# Patient Record
Sex: Male | Born: 1950 | Race: Black or African American | Hispanic: No | Marital: Married | Smoking: Never smoker
Health system: Southern US, Community
[De-identification: ages and names within clinical notes are randomized; demographics above are authoritative.]

## PROBLEM LIST (undated history)

## (undated) DIAGNOSIS — I10 Essential (primary) hypertension: Secondary | ICD-10-CM

## (undated) DIAGNOSIS — E785 Hyperlipidemia, unspecified: Secondary | ICD-10-CM

## (undated) DIAGNOSIS — M1990 Unspecified osteoarthritis, unspecified site: Secondary | ICD-10-CM

## (undated) HISTORY — DX: Unspecified osteoarthritis, unspecified site: M19.90

## (undated) HISTORY — DX: Hyperlipidemia, unspecified: E78.5

---

## 1969-02-18 HISTORY — PX: APPENDECTOMY: SHX54

## 1973-02-18 HISTORY — PX: OTHER SURGICAL HISTORY: SHX169

## 1978-02-18 HISTORY — PX: LUNG BIOPSY: SHX232

## 2010-12-08 ENCOUNTER — Emergency Department (INDEPENDENT_AMBULATORY_CARE_PROVIDER_SITE_OTHER): Payer: BC Managed Care – PPO

## 2010-12-08 ENCOUNTER — Encounter: Payer: Self-pay | Admitting: Emergency Medicine

## 2010-12-08 ENCOUNTER — Emergency Department (HOSPITAL_BASED_OUTPATIENT_CLINIC_OR_DEPARTMENT_OTHER)
Admission: EM | Admit: 2010-12-08 | Discharge: 2010-12-08 | Disposition: A | Discharge status: Home / Self Care | Payer: BC Managed Care – PPO | Attending: Emergency Medicine | Admitting: Emergency Medicine

## 2010-12-08 DIAGNOSIS — Z8639 Personal history of other endocrine, nutritional and metabolic disease: Secondary | ICD-10-CM

## 2010-12-08 DIAGNOSIS — M109 Gout, unspecified: Secondary | ICD-10-CM | POA: Insufficient documentation

## 2010-12-08 DIAGNOSIS — M25529 Pain in unspecified elbow: Secondary | ICD-10-CM

## 2010-12-08 DIAGNOSIS — E119 Type 2 diabetes mellitus without complications: Secondary | ICD-10-CM | POA: Insufficient documentation

## 2010-12-08 DIAGNOSIS — M7989 Other specified soft tissue disorders: Secondary | ICD-10-CM

## 2010-12-08 DIAGNOSIS — I10 Essential (primary) hypertension: Secondary | ICD-10-CM | POA: Insufficient documentation

## 2010-12-08 DIAGNOSIS — Z862 Personal history of diseases of the blood and blood-forming organs and certain disorders involving the immune mechanism: Secondary | ICD-10-CM

## 2010-12-08 HISTORY — DX: Essential (primary) hypertension: I10

## 2010-12-08 LAB — GLUCOSE, CAPILLARY: Glucose-Capillary: 125 mg/dL — ABNORMAL HIGH (ref 70–99)

## 2010-12-08 MED ORDER — OXYCODONE-ACETAMINOPHEN 5-325 MG PO TABS
2.0000 | ORAL_TABLET | Freq: Once | ORAL | Status: AC
  Administered 2010-12-08: 2 via ORAL
  Filled 2010-12-08: qty 2

## 2010-12-08 MED ORDER — OXYCODONE-ACETAMINOPHEN 5-325 MG PO TABS: 2.0000 | ORAL_TABLET | ORAL | Status: AC | PRN

## 2010-12-08 MED ORDER — INDOMETHACIN 25 MG PO CAPS: 25.0000 mg | ORAL_CAPSULE | Freq: Three times a day (TID) | ORAL | Status: DC | PRN

## 2010-12-08 NOTE — ED Provider Notes (Signed)
History     CSN: [id number] Arrival date & time: 12/08/2010  8:29 AM   First MD Initiated Contact with Patient 12/08/10 [phone]      Chief Complaint  Patient presents with  . Elbow Pain    (Consider location/radiation/quality/duration/timing/severity/associated sxs/prior treatment) HPI   Pain began in left elbow on Wednesday.  Increasing.  The pain like prior gout attacks.  The pain 10/10.  Patient was taking allopurinol but not taking three years ago when gout was better.    Past Medical History  Diagnosis Date  . Gout   . Diabetes mellitus   . Hypertension    reviewed Past Surgical History  Procedure Date  . Lung biopsy     No family history on file.  History  Substance Use Topics  . Smoking status: Never Smoker   . Smokeless tobacco: Not on file  . Alcohol Use: No      Review of Systems  All other systems reviewed and are negative.    Allergies  Review of patient's allergies indicates no known allergies.  Home Medications  No current outpatient prescriptions on file.  BP 165/97  Pulse 74  Temp(Src) 98 F (36.7 C) (Oral)  Resp 16  SpO2 98%  Physical Exam  Nursing note and vitals reviewed. Constitutional: He is oriented to person, place, and time. He appears well-developed and well-nourished.  HENT:  Head: Normocephalic and atraumatic.  Eyes: Conjunctivae are normal. Pupils are equal, round, and reactive to light.  Neck: Normal range of motion.  Cardiovascular: Normal rate.   Pulmonary/Chest: Effort normal.  Abdominal: Soft.  Musculoskeletal: He exhibits tenderness. He exhibits no edema.       Left elbow with tenderness and swelling posterior aspect.  Full arom.  Neurological: He is alert and oriented to person, place, and time. He has normal reflexes.  Skin: Skin is warm and dry.  Psychiatric: He has a normal mood and affect.    ED Course  Procedures (including critical care time)  Labs Reviewed - No data to display No results  found.   No diagnosis found.    MDM          Hilario Quarry, MD 12/08/10 [phone]

## 2010-12-08 NOTE — ED Notes (Signed)
Pt reports he has not taken rx meds for DM, HTN x 2-3 yrs

## 2010-12-08 NOTE — ED Notes (Signed)
Pt c/o LT elbow pain/ swelling since Wed

## 2011-01-11 ENCOUNTER — Ambulatory Visit (HOSPITAL_BASED_OUTPATIENT_CLINIC_OR_DEPARTMENT_OTHER)
Admission: RE | Admit: 2011-01-11 | Discharge: 2011-01-11 | Disposition: A | Discharge status: Home / Self Care | Payer: BC Managed Care – PPO | Source: Ambulatory Visit | Attending: Internal Medicine | Admitting: Internal Medicine

## 2011-01-11 ENCOUNTER — Telehealth: Payer: Self-pay | Admitting: Internal Medicine

## 2011-01-11 ENCOUNTER — Encounter: Payer: Self-pay | Admitting: Internal Medicine

## 2011-01-11 ENCOUNTER — Ambulatory Visit (INDEPENDENT_AMBULATORY_CARE_PROVIDER_SITE_OTHER): Payer: BC Managed Care – PPO | Admitting: Internal Medicine

## 2011-01-11 VITALS — BP 140/90 | HR 78 | Temp 98.1°F | Resp 18 | Ht 68.5 in | Wt 207.0 lb

## 2011-01-11 DIAGNOSIS — M792 Neuralgia and neuritis, unspecified: Secondary | ICD-10-CM

## 2011-01-11 DIAGNOSIS — Z23 Encounter for immunization: Secondary | ICD-10-CM

## 2011-01-11 DIAGNOSIS — E785 Hyperlipidemia, unspecified: Secondary | ICD-10-CM

## 2011-01-11 DIAGNOSIS — M109 Gout, unspecified: Secondary | ICD-10-CM

## 2011-01-11 DIAGNOSIS — E119 Type 2 diabetes mellitus without complications: Secondary | ICD-10-CM

## 2011-01-11 DIAGNOSIS — M5030 Other cervical disc degeneration, unspecified cervical region: Secondary | ICD-10-CM | POA: Insufficient documentation

## 2011-01-11 DIAGNOSIS — M79609 Pain in unspecified limb: Secondary | ICD-10-CM

## 2011-01-11 DIAGNOSIS — IMO0002 Reserved for concepts with insufficient information to code with codable children: Secondary | ICD-10-CM

## 2011-01-11 DIAGNOSIS — R972 Elevated prostate specific antigen [PSA]: Secondary | ICD-10-CM

## 2011-01-11 LAB — CBC
HCT: 44.8 % (ref 39.0–52.0)
Hemoglobin: 14.6 g/dL (ref 13.0–17.0)
MCH: 27.5 pg (ref 26.0–34.0)
MCHC: 32.6 g/dL (ref 30.0–36.0)
MCV: 84.5 fL (ref 78.0–100.0)
Platelets: 134 10*3/uL — ABNORMAL LOW (ref 150–400)
RBC: 5.3 MIL/uL (ref 4.22–5.81)
RDW: 13.6 % (ref 11.5–15.5)
WBC: 4 10*3/uL (ref 4.0–10.5)

## 2011-01-11 LAB — MICROALBUMIN / CREATININE URINE RATIO
Creatinine, Urine: 236 mg/dL
Microalb Creat Ratio: 96.4 mg/g — ABNORMAL HIGH (ref 0.0–30.0)
Microalb, Ur: 22.76 mg/dL — ABNORMAL HIGH (ref 0.00–1.89)

## 2011-01-11 LAB — BASIC METABOLIC PANEL
BUN: 15 mg/dL (ref 6–23)
CO2: 26 mEq/L (ref 19–32)
Calcium: 9.8 mg/dL (ref 8.4–10.5)
Chloride: 104 mEq/L (ref 96–112)
Creat: 1.2 mg/dL (ref 0.50–1.35)
Glucose, Bld: 107 mg/dL — ABNORMAL HIGH (ref 70–99)
Potassium: 4.2 mEq/L (ref 3.5–5.3)
Sodium: 140 mEq/L (ref 135–145)

## 2011-01-11 LAB — HEPATIC FUNCTION PANEL
ALT: 27 U/L (ref 0–53)
AST: 29 U/L (ref 0–37)
Albumin: 4.6 g/dL (ref 3.5–5.2)
Alkaline Phosphatase: 61 U/L (ref 39–117)
Bilirubin, Direct: 0.1 mg/dL (ref 0.0–0.3)
Indirect Bilirubin: 0.2 mg/dL (ref 0.0–0.9)
Total Bilirubin: 0.3 mg/dL (ref 0.3–1.2)
Total Protein: 7.3 g/dL (ref 6.0–8.3)

## 2011-01-11 LAB — HEMOGLOBIN A1C
Hgb A1c MFr Bld: 6.1 % — ABNORMAL HIGH (ref <5.7)
Mean Plasma Glucose: 128 mg/dL — ABNORMAL HIGH (ref <117)

## 2011-01-11 LAB — PSA: PSA: 9.52 ng/mL — ABNORMAL HIGH (ref <=4.00)

## 2011-01-11 MED ORDER — DICLOFENAC SODIUM 75 MG PO TBEC: 75.0000 mg | DELAYED_RELEASE_TABLET | Freq: Two times a day (BID) | ORAL | Status: DC | PRN

## 2011-01-11 NOTE — Progress Notes (Signed)
  Subjective:    Patient ID: Russell Shannon, male    DOB: 07-30-1950, 60 y.o.   MRN: [id number]  HPI Pt presents to clinic to establish care and for follow up of multiple medical problems. H/o DM previously on metformin but states stopped over a year ago after beginning to exercise and lose wt.  Recent fasting blood sugars 118-135 without hypoglycemia. Has rare gerd sx's with food triggers only. H/o gout with recent exacerbation involving left elbow. Seen in ED and given indocin which he tolerated without gi adverse effect. Pain now resoloved. This recent episode has been the only gout episode this year. Recalls h/o elevated psa with reported neg prostate bx ~2010. No recent follow up of psa. Also recalls past lung bx negative for malignancy. Is a non smoker. Does note chronic intermittent over several months h/o pain located in left trapezius area. Does intermittently radiate down the left arm without paresthesia or weakness. Denies neck pain. Pain improved with aleve. No other complaints.   Past Medical History  Diagnosis Date  . Gout   . Diabetes mellitus   . Hypertension    Past Surgical History  Procedure Date  . Lung biopsy     reports that he has never smoked. He has never used smokeless tobacco. He reports that he does not drink alcohol or use illicit drugs. family history is not on file. No Known Allergies    Review of Systems  Respiratory: Negative for cough and shortness of breath.   Cardiovascular: Negative for chest pain.  Gastrointestinal: Negative for abdominal pain and blood in stool.  Musculoskeletal: Positive for arthralgias.  Neurological: Negative for weakness and numbness.  All other systems reviewed and are negative.       Objective:   Physical Exam  Nursing note and vitals reviewed. Constitutional: He appears well-developed and well-nourished. No distress.  HENT:  Head: Normocephalic and atraumatic.  Right Ear: External ear normal.  Left Ear: External  ear normal.  Nose: Nose normal.  Mouth/Throat: Oropharynx is clear and moist. No oropharyngeal exudate.  Eyes: Conjunctivae are normal. Pupils are equal, round, and reactive to light. No scleral icterus.  Neck: Neck supple. Carotid bruit is not present.  Cardiovascular: Normal rate, regular rhythm, normal heart sounds and intact distal pulses.  Exam reveals no gallop and no friction rub.   No murmur heard. Pulmonary/Chest: Effort normal and breath sounds normal. No respiratory distress. He has no wheezes. He has no rales.  Musculoskeletal:       FROM neck and left shoulder. NT. Strength grossly intact distal/proximal left arm.  Lymphadenopathy:    He has no cervical adenopathy.  Neurological: He is alert.  Skin: Skin is warm and dry. He is not diaphoretic.  Psychiatric: He has a normal mood and affect.  Diabetic foot exam: +2 DP pulses, no diabetic wounds, ulcerations or significant callousing. Monofilament exam nl.         Assessment & Plan:

## 2011-01-11 NOTE — Patient Instructions (Signed)
Please schedule chem7, a1c 250.0 and lipid 272.4 prior to next visit 

## 2011-01-11 NOTE — Assessment & Plan Note (Signed)
Obtain cspine radiograph. Attempt voltaren with food and no other nsaids. Followup if no improvement or worsening.

## 2011-01-11 NOTE — Assessment & Plan Note (Signed)
Not currently fasting. Obtain lipid profile prior to next visit

## 2011-01-11 NOTE — Assessment & Plan Note (Signed)
Obtain cbc, chem7, a1c, urine microalbumin. Currently not requiring medication pending lab results. Schedule yearly optho diabetic eye exam.

## 2011-01-11 NOTE — Telephone Encounter (Signed)
Lab orders entered for February 2013. 

## 2011-01-11 NOTE — Assessment & Plan Note (Signed)
Obtain psa. Understands pro's and cons of psa measurement and potential for false positives. H/o neg prostate bx 2 years ago

## 2011-01-11 NOTE — Assessment & Plan Note (Signed)
Recent episode resolved. No current indication for prophylaxis. Previously took allopurinol remotely.

## 2011-02-15 LAB — HM DIABETES EYE EXAM

## 2011-02-21 ENCOUNTER — Encounter: Payer: Self-pay | Admitting: Internal Medicine

## 2011-02-27 ENCOUNTER — Telehealth: Payer: Self-pay | Admitting: *Deleted

## 2011-02-27 NOTE — Telephone Encounter (Signed)
Pt left voice message stating he would like to proceed with urology referral. Please advise.

## 2011-02-28 ENCOUNTER — Other Ambulatory Visit: Payer: Self-pay | Admitting: Internal Medicine

## 2011-02-28 DIAGNOSIS — R972 Elevated prostate specific antigen [PSA]: Secondary | ICD-10-CM

## 2011-02-28 NOTE — Telephone Encounter (Signed)
Referral placed.

## 2011-02-28 NOTE — Telephone Encounter (Signed)
Left detailed message on home voicemail that referral has been placed and to call us back if he hasn't heard from [place] by the end of next week.

## 2011-03-29 LAB — BASIC METABOLIC PANEL
BUN: 14 mg/dL (ref 6–23)
CO2: 30 mEq/L (ref 19–32)
Calcium: 9.2 mg/dL (ref 8.4–10.5)
Chloride: 105 mEq/L (ref 96–112)
Creat: 0.91 mg/dL (ref 0.50–1.35)
Glucose, Bld: 115 mg/dL — ABNORMAL HIGH (ref 70–99)
Potassium: 4.4 mEq/L (ref 3.5–5.3)
Sodium: 141 mEq/L (ref 135–145)

## 2011-03-29 LAB — HEMOGLOBIN A1C
Hgb A1c MFr Bld: 6.4 % — ABNORMAL HIGH (ref <5.7)
Mean Plasma Glucose: 137 mg/dL — ABNORMAL HIGH (ref <117)

## 2011-03-29 NOTE — Telephone Encounter (Signed)
Addended by: Mervin Kung A on: 03/29/2011 09:49 AM   Modules accepted: Orders

## 2011-03-30 LAB — LIPID PANEL
Cholesterol: 181 mg/dL (ref 0–200)
HDL: 39 mg/dL — ABNORMAL LOW (ref >39)
LDL Cholesterol: 127 mg/dL — ABNORMAL HIGH (ref 0–99)
Total CHOL/HDL Ratio: 4.6 Ratio
Triglycerides: 76 mg/dL (ref <150)
VLDL: 15 mg/dL (ref 0–40)

## 2011-04-05 ENCOUNTER — Ambulatory Visit (HOSPITAL_BASED_OUTPATIENT_CLINIC_OR_DEPARTMENT_OTHER)
Admission: RE | Admit: 2011-04-05 | Discharge: 2011-04-05 | Disposition: A | Discharge status: Home / Self Care | Payer: BC Managed Care – PPO | Source: Ambulatory Visit | Attending: Internal Medicine | Admitting: Internal Medicine

## 2011-04-05 ENCOUNTER — Encounter: Payer: Self-pay | Admitting: Internal Medicine

## 2011-04-05 ENCOUNTER — Ambulatory Visit (INDEPENDENT_AMBULATORY_CARE_PROVIDER_SITE_OTHER): Payer: BC Managed Care – PPO | Admitting: Internal Medicine

## 2011-04-05 VITALS — BP 138/90 | HR 70 | Temp 97.8°F | Resp 18 | Ht 68.5 in | Wt 213.0 lb

## 2011-04-05 DIAGNOSIS — Z23 Encounter for immunization: Secondary | ICD-10-CM

## 2011-04-05 DIAGNOSIS — M25519 Pain in unspecified shoulder: Secondary | ICD-10-CM | POA: Insufficient documentation

## 2011-04-05 DIAGNOSIS — E785 Hyperlipidemia, unspecified: Secondary | ICD-10-CM

## 2011-04-05 MED ORDER — METHYLPREDNISOLONE 4 MG PO KIT: PACK | ORAL | Status: AC

## 2011-04-05 MED ORDER — PRAVASTATIN SODIUM 20 MG PO TABS: 20.0000 mg | ORAL_TABLET | Freq: Every day | ORAL | Status: DC

## 2011-04-05 NOTE — Assessment & Plan Note (Signed)
Begin pravastatin 20mg  po qd.

## 2011-04-05 NOTE — Patient Instructions (Signed)
Please schedule chem7, a1c (250.0) and lipid/lft (272.4) prior to next visit 

## 2011-04-05 NOTE — Progress Notes (Signed)
  Subjective:    Patient ID: Russell Shannon, male    DOB: 12/25/1950, 61 y.o.   MRN: [id number]  HPI Pt presents to clinic for followup of multiple medical problems. Has persistent left trap ?shoulder pain. 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 Presentation today more suggestive of primary shoulder etiology. Pain improved with voltaren but returned when ran out. Now taking advil otc prn with some improvement. Reports fsbs range 108-130 without hypoglycemia. Reviewed ldl elevated. Per pt previously recommended for statin tx but did not take. H/o elevated psa and states is seeing urology today.  Past Medical History  Diagnosis Date  . Gout   . Diabetes mellitus   . Hypertension    Past Surgical History  Procedure Date  . Lung biopsy     reports that he has never smoked. He has never used smokeless tobacco. He reports that he does not drink alcohol or use illicit drugs. family history is not on file. No Known Allergies    Review of Systems see hpi     Objective:   Physical Exam  Physical Exam  Nursing note and vitals reviewed. Constitutional: Appears well-developed and well-nourished. No distress.  HENT:  Head: Normocephalic and atraumatic.  Right Ear: External ear normal.  Left Ear: External ear normal.  Eyes: Conjunctivae are normal. No scleral icterus.  Neck: Neck supple. Carotid bruit is not present.  Cardiovascular: Normal rate, regular rhythm and normal heart sounds.  Exam reveals no gallop and no friction rub.   No murmur heard. Pulmonary/Chest: Effort normal and breath sounds normal. No respiratory distress. He has no wheezes. no rales.  Lymphadenopathy:    He has no cervical adenopathy.  Neurological:Alert.  Skin: Skin is warm and dry. Not diaphoretic.  Psychiatric: Has a normal mood and affect.  MSK: FROM left shoulder. +pain with abduction with resistance. No crepitus. Distal left hand mcp/wrist strength 5/5.     Assessment  & Plan:

## 2011-04-05 NOTE — Assessment & Plan Note (Signed)
Obtain xray left shoulder. Stop advil -attempt medrol dosepak. Consider orthopedic referral if no improvement. Defers PT due to being out of town

## 2011-06-07 ENCOUNTER — Ambulatory Visit (INDEPENDENT_AMBULATORY_CARE_PROVIDER_SITE_OTHER): Payer: BC Managed Care – PPO | Admitting: Internal Medicine

## 2011-06-07 ENCOUNTER — Encounter: Payer: Self-pay | Admitting: Internal Medicine

## 2011-06-07 VITALS — BP 150/100 | HR 74 | Temp 98.2°F | Ht 68.5 in | Wt 218.0 lb

## 2011-06-07 DIAGNOSIS — I10 Essential (primary) hypertension: Secondary | ICD-10-CM | POA: Insufficient documentation

## 2011-06-07 DIAGNOSIS — E785 Hyperlipidemia, unspecified: Secondary | ICD-10-CM

## 2011-06-07 DIAGNOSIS — E119 Type 2 diabetes mellitus without complications: Secondary | ICD-10-CM

## 2011-06-07 MED ORDER — LOSARTAN POTASSIUM 50 MG PO TABS: 50.0000 mg | ORAL_TABLET | Freq: Every day | ORAL | Status: DC

## 2011-06-07 NOTE — Assessment & Plan Note (Signed)
Obtain a1c, chem7 

## 2011-06-07 NOTE — Assessment & Plan Note (Signed)
New dx. Asx. Begin losartan 50mg  qd. Monitor bp as outpt and f/u in clinic as scheduled.

## 2011-06-07 NOTE — Progress Notes (Signed)
  Subjective:    Patient ID: Russell Shannon, male    DOB: 1950-09-18, 61 y.o.   MRN: [id number]  HPI HPIPt presents to clinic for followup of multiple medical problems. Tolerates statin tx without myalgias. BP reviewed persistently elevated without sx's. H/o elevated psa and now s/p urology consult. States underwent neg bx and is now on unspecified medication for enlarged prostate.     Past Medical History  Diagnosis Date  . Gout   . Diabetes mellitus   . Hypertension    Past Surgical History  Procedure Date  . Lung biopsy     reports that he has never smoked. He has never used smokeless tobacco. He reports that he does not drink alcohol or use illicit drugs. family history is not on file. No Known Allergies   Review of Systems see hpi     Objective:   Physical Exam  Physical Exam  Nursing note and vitals reviewed. Constitutional: Appears well-developed and well-nourished. No distress.  HENT:  Head: Normocephalic and atraumatic.  Right Ear: External ear normal.  Left Ear: External ear normal.  Eyes: Conjunctivae are normal. No scleral icterus.  Neck: Neck supple. Carotid bruit is not present.  Cardiovascular: Normal rate, regular rhythm and normal heart sounds.  Exam reveals no gallop and no friction rub.   No murmur heard. Pulmonary/Chest: Effort normal and breath sounds normal. No respiratory distress. He has no wheezes. no rales.  Lymphadenopathy:    He has no cervical adenopathy.  Neurological:Alert.  Skin: Skin is warm and dry. Not diaphoretic.  Psychiatric: Has a normal mood and affect.  Diabetic foot exam: +2 DP pulses, no diabetic wounds, ulcerations or significant callousing. Monofilament exam nl.       Assessment & Plan:

## 2011-06-07 NOTE — Assessment & Plan Note (Signed)
Obtain lipid/lft. 

## 2011-06-08 LAB — HEPATIC FUNCTION PANEL
ALT: 30 U/L (ref 0–53)
AST: 34 U/L (ref 0–37)
Albumin: 4.5 g/dL (ref 3.5–5.2)
Alkaline Phosphatase: 52 U/L (ref 39–117)
Bilirubin, Direct: 0.1 mg/dL (ref 0.0–0.3)
Indirect Bilirubin: 0.3 mg/dL (ref 0.0–0.9)
Total Bilirubin: 0.4 mg/dL (ref 0.3–1.2)
Total Protein: 7.1 g/dL (ref 6.0–8.3)

## 2011-06-08 LAB — LIPID PANEL
Cholesterol: 207 mg/dL — ABNORMAL HIGH (ref 0–200)
HDL: 47 mg/dL (ref >39)
LDL Cholesterol: 141 mg/dL — ABNORMAL HIGH (ref 0–99)
Total CHOL/HDL Ratio: 4.4 Ratio
Triglycerides: 97 mg/dL (ref <150)
VLDL: 19 mg/dL (ref 0–40)

## 2011-06-08 LAB — BASIC METABOLIC PANEL
BUN: 13 mg/dL (ref 6–23)
CO2: 24 mEq/L (ref 19–32)
Calcium: 9.3 mg/dL (ref 8.4–10.5)
Chloride: 105 mEq/L (ref 96–112)
Creat: 0.86 mg/dL (ref 0.50–1.35)
Glucose, Bld: 116 mg/dL — ABNORMAL HIGH (ref 70–99)
Potassium: 3.9 mEq/L (ref 3.5–5.3)
Sodium: 140 mEq/L (ref 135–145)

## 2011-06-08 LAB — HEMOGLOBIN A1C
Hgb A1c MFr Bld: 6.4 % — ABNORMAL HIGH (ref <5.7)
Mean Plasma Glucose: 137 mg/dL — ABNORMAL HIGH (ref <117)

## 2011-06-14 ENCOUNTER — Other Ambulatory Visit: Payer: Self-pay | Admitting: Internal Medicine

## 2011-06-14 DIAGNOSIS — E785 Hyperlipidemia, unspecified: Secondary | ICD-10-CM

## 2011-06-14 MED ORDER — PRAVASTATIN SODIUM 40 MG PO TABS: 40.0000 mg | ORAL_TABLET | Freq: Every day | ORAL | Status: DC

## 2011-07-05 LAB — HEPATIC FUNCTION PANEL
ALT: 22 U/L (ref 0–53)
AST: 28 U/L (ref 0–37)
Albumin: 4.6 g/dL (ref 3.5–5.2)
Alkaline Phosphatase: 43 U/L (ref 39–117)
Bilirubin, Direct: 0.1 mg/dL (ref 0.0–0.3)
Indirect Bilirubin: 0.4 mg/dL (ref 0.0–0.9)
Total Bilirubin: 0.5 mg/dL (ref 0.3–1.2)
Total Protein: 7 g/dL (ref 6.0–8.3)

## 2011-07-05 NOTE — Progress Notes (Signed)
Addended by: Mervin Kung A on: 07/05/2011 10:19 AM   Modules accepted: Orders

## 2011-07-06 LAB — LIPID PANEL
Cholesterol: 172 mg/dL (ref 0–200)
HDL: 42 mg/dL (ref >39)
LDL Cholesterol: 117 mg/dL — ABNORMAL HIGH (ref 0–99)
Total CHOL/HDL Ratio: 4.1 Ratio
Triglycerides: 65 mg/dL (ref <150)
VLDL: 13 mg/dL (ref 0–40)

## 2011-07-12 ENCOUNTER — Telehealth: Payer: Self-pay | Admitting: Internal Medicine

## 2011-07-12 ENCOUNTER — Ambulatory Visit (INDEPENDENT_AMBULATORY_CARE_PROVIDER_SITE_OTHER): Payer: BC Managed Care – PPO | Admitting: Internal Medicine

## 2011-07-12 ENCOUNTER — Encounter: Payer: Self-pay | Admitting: Internal Medicine

## 2011-07-12 VITALS — BP 118/82 | HR 67 | Temp 98.0°F | Resp 18 | Wt 215.0 lb

## 2011-07-12 DIAGNOSIS — I10 Essential (primary) hypertension: Secondary | ICD-10-CM

## 2011-07-12 DIAGNOSIS — E785 Hyperlipidemia, unspecified: Secondary | ICD-10-CM

## 2011-07-12 DIAGNOSIS — E119 Type 2 diabetes mellitus without complications: Secondary | ICD-10-CM

## 2011-07-12 NOTE — Progress Notes (Signed)
  Subjective:    Patient ID: Russell Shannon, male    DOB: 07/17/1950, 61 y.o.   MRN: [id number]  HPI Pt presents to clinic for followup of multiple medical problems. Has recent dx of HTN and placed on losartan 50mg  qd. Tolerating without side effect. Denies cough. bp reviewed normotensive. Reviewed a1c 6.4. LDL reviewed mildly above goal.   Past Medical History  Diagnosis Date  . Gout   . Diabetes mellitus   . Hypertension    Past Surgical History  Procedure Date  . Lung biopsy     reports that he has never smoked. He has never used smokeless tobacco. He reports that he does not drink alcohol or use illicit drugs. family history is not on file. No Known Allergies    Review of Systems see hpi     Objective:   Physical Exam  Physical Exam  Nursing note and vitals reviewed. Constitutional: Appears well-developed and well-nourished. No distress.  HENT:  Head: Normocephalic and atraumatic.  Right Ear: External ear normal.  Left Ear: External ear normal.  Eyes: Conjunctivae are normal. No scleral icterus.  Neck: Neck supple. Cardiovascular: Normal rate, regular rhythm and normal heart sounds.  Exam reveals no gallop and no friction rub.   No murmur heard. Pulmonary/Chest: Effort normal and breath sounds normal. No respiratory distress. He has no wheezes. no rales.   Neurological:Alert.  Skin: Skin is warm and dry. Not diaphoretic.  Psychiatric: Has a normal mood and affect.        Assessment & Plan:

## 2011-07-12 NOTE — Patient Instructions (Signed)
Please schedule fasting labs prior to next visit Chem7, a1c-250.0 and lipid-272.4 

## 2011-07-12 NOTE — Assessment & Plan Note (Signed)
Normotensive and stable. Continue current regimen. Monitor bp as outpt and followup in clinic as scheduled.  

## 2011-07-12 NOTE — Telephone Encounter (Signed)
Lab order entered for August 2013. 

## 2011-07-12 NOTE — Assessment & Plan Note (Signed)
LDL above goal. Continue current statin dose. Increase exercise and pursue low fat diet. Obtain lipid prior to next visit. If remains suboptimal then address medication

## 2011-07-12 NOTE — Assessment & Plan Note (Signed)
Good control. Encourage appropriate diabetic diet and regular exercise. Obtain chem7, a1c prior to next visit

## 2011-07-18 ENCOUNTER — Telehealth: Payer: Self-pay | Admitting: Internal Medicine

## 2011-07-18 DIAGNOSIS — E785 Hyperlipidemia, unspecified: Secondary | ICD-10-CM

## 2011-07-18 MED ORDER — PRAVASTATIN SODIUM 40 MG PO TABS: 40.0000 mg | ORAL_TABLET | Freq: Every day | ORAL | Status: DC

## 2011-07-18 MED ORDER — LOSARTAN POTASSIUM 50 MG PO TABS: 50.0000 mg | ORAL_TABLET | Freq: Every day | ORAL | Status: DC

## 2011-07-18 NOTE — Telephone Encounter (Signed)
Rx sent to [facility]

## 2011-07-18 NOTE — Telephone Encounter (Signed)
New prescription request  Pravastatin sodium 20mg  tab. Qty 90 days supply. Refills 4  Losartan potassium 50mg  tab. Qty 90 days supply. Refills 4

## 2011-07-26 ENCOUNTER — Telehealth: Payer: Self-pay | Admitting: Internal Medicine

## 2011-07-26 NOTE — Telephone Encounter (Signed)
Patient called and left a voice message requesting medication refills to [facility].  Call was returned to patient at [phone], no answer. A voice message was left for patient to return phone call with the name of the medications he is requesting refills on.

## 2011-07-29 NOTE — Telephone Encounter (Signed)
Call placed to patient at [phone], no answer. A detailed voice message was left for patient to return call regarding medication refill.

## 2011-08-05 ENCOUNTER — Encounter: Payer: Self-pay | Admitting: Internal Medicine

## 2011-08-05 ENCOUNTER — Ambulatory Visit (INDEPENDENT_AMBULATORY_CARE_PROVIDER_SITE_OTHER): Payer: BC Managed Care – PPO | Admitting: Internal Medicine

## 2011-08-05 VITALS — BP 122/90 | HR 77 | Temp 98.2°F | Resp 18 | Ht 68.5 in | Wt 214.0 lb

## 2011-08-05 DIAGNOSIS — M109 Gout, unspecified: Secondary | ICD-10-CM

## 2011-08-05 DIAGNOSIS — E119 Type 2 diabetes mellitus without complications: Secondary | ICD-10-CM

## 2011-08-05 MED ORDER — COLCHICINE 0.6 MG PO TABS: 0.6000 mg | ORAL_TABLET | Freq: Two times a day (BID) | ORAL | Status: DC | PRN

## 2011-08-05 MED ORDER — METHYLPREDNISOLONE ACETATE 20 MG/ML IJ SUSP
20.0000 mg | Freq: Once | INTRAMUSCULAR | Status: AC
  Administered 2011-08-05: 20 mg via INTRAMUSCULAR

## 2011-08-05 NOTE — Assessment & Plan Note (Signed)
Discussed potential for hyperglycemia with steroid injxn. DM under good control. Advised close daily monitoring of fsbs.

## 2011-08-05 NOTE — Assessment & Plan Note (Signed)
Administer depomedrol 20mg  im. Monitor fsbs carefully. Begin colchicine bid prn. Followup if no improvement or worsening.

## 2011-08-05 NOTE — Progress Notes (Signed)
  Subjective:    Patient ID: Russell Shannon, male    DOB: Jan 15, 1951, 61 y.o.   MRN: [id number]  HPI Pt presents to clinic for evaluation of left elbow pain. Notes 3-4 day h/o left elbow pain, swelling and warmth. States is c/w previous gout attacks. No fever, chills, break in skin or drainage. Took unspecified pain medication at home with some improvement. Swelling and pain some better today. H/o DM well controlled with last a1c 6.4. No other alleviating or exacerbating factors.   Past Medical History  Diagnosis Date  . Gout   . Diabetes mellitus   . Hypertension    Past Surgical History  Procedure Date  . Lung biopsy     reports that he has never smoked. He has never used smokeless tobacco. He reports that he does not drink alcohol or use illicit drugs. family history is not on file. No Known Allergies   Review of Systems see hpi     Objective:   Physical Exam  Nursing note and vitals reviewed. Constitutional: He appears well-developed and well-nourished. No distress.  Musculoskeletal:       Left elbow:FROM. Slightly tender to palpation. Mild warmth. +ST swelling without obvious effusion.   Neurological: He is alert.  Skin: Skin is warm and dry. No rash noted. He is not diaphoretic. No erythema.  Psychiatric: He has a normal mood and affect.          Assessment & Plan:

## 2011-10-15 ENCOUNTER — Ambulatory Visit (INDEPENDENT_AMBULATORY_CARE_PROVIDER_SITE_OTHER): Payer: Self-pay | Admitting: Internal Medicine

## 2011-10-15 ENCOUNTER — Encounter: Payer: Self-pay | Admitting: Internal Medicine

## 2011-10-15 VITALS — BP 116/82 | HR 94 | Temp 98.5°F | Resp 16 | Wt 210.5 lb

## 2011-10-15 DIAGNOSIS — R059 Cough, unspecified: Secondary | ICD-10-CM

## 2011-10-15 DIAGNOSIS — R05 Cough: Secondary | ICD-10-CM

## 2011-10-15 DIAGNOSIS — I319 Disease of pericardium, unspecified: Secondary | ICD-10-CM

## 2011-10-15 MED ORDER — AMOXICILLIN-POT CLAVULANATE 875-125 MG PO TABS: 1.0000 | ORAL_TABLET | Freq: Two times a day (BID) | ORAL | Status: AC

## 2011-10-15 NOTE — Progress Notes (Signed)
  Subjective:    Patient ID: Russell Shannon, male    DOB: [birth year], 61 y.o.   MRN: [id number]  HPI Pt presents to clinic for hospital follow up of pericarditis. Discharge summary reviewed from [facility]. Developed cp and was diagnosed with pericarditis with secondary effusion without tamponade. Etiology suspected to be viral infxn. States cp resolved and has f/u appt with cardiology in near future. Has persistent np cough for two weeks that began in hospital. Apparently underwent chest CT without mass or infiltrate but mention of atelectasis. Denies f/c, hemoptysis or wheezing.   Past Medical History  Diagnosis Date  . Gout   . Diabetes mellitus   . Hypertension    Past Surgical History  Procedure Date  . Lung biopsy     reports that he has never smoked. He has never used smokeless tobacco. He reports that he does not drink alcohol or use illicit drugs. family history is not on file. No Known Allergies   Review of Systems see hpi     Objective:   Physical Exam  Nursing note and vitals reviewed. Constitutional: He appears well-developed and well-nourished. No distress.  HENT:  Head: Normocephalic and atraumatic.  Right Ear: External ear normal.  Left Ear: External ear normal.  Eyes: Conjunctivae are normal. No scleral icterus.  Cardiovascular: Normal rate, regular rhythm and normal heart sounds.  Exam reveals no gallop and no friction rub.   No murmur heard. Pulmonary/Chest: Effort normal and breath sounds normal. No respiratory distress. He has no wheezes. He has no rales.  Neurological: He is alert.  Skin: He is not diaphoretic.  Psychiatric: He has a normal mood and affect.          Assessment & Plan:

## 2011-10-15 NOTE — Assessment & Plan Note (Signed)
Begin abx tx. If no resolution with abx course return to clinic for re-evaluation and cxr.

## 2011-10-15 NOTE — Assessment & Plan Note (Signed)
Asx. Keep cardiology follow up

## 2011-10-18 ENCOUNTER — Ambulatory Visit: Payer: BC Managed Care – PPO | Admitting: Internal Medicine

## 2011-11-12 ENCOUNTER — Ambulatory Visit: Payer: Self-pay | Admitting: Internal Medicine

## 2011-11-12 DIAGNOSIS — Z0289 Encounter for other administrative examinations: Secondary | ICD-10-CM

## 2012-08-27 ENCOUNTER — Other Ambulatory Visit: Payer: Self-pay

## 2012-10-17 ENCOUNTER — Emergency Department (HOSPITAL_BASED_OUTPATIENT_CLINIC_OR_DEPARTMENT_OTHER)
Admission: EM | Admit: 2012-10-17 | Discharge: 2012-10-17 | Disposition: A | Discharge status: Home / Self Care | Payer: BC Managed Care – PPO | Attending: Emergency Medicine | Admitting: Emergency Medicine

## 2012-10-17 ENCOUNTER — Emergency Department (HOSPITAL_BASED_OUTPATIENT_CLINIC_OR_DEPARTMENT_OTHER): Payer: BC Managed Care – PPO

## 2012-10-17 ENCOUNTER — Encounter (HOSPITAL_BASED_OUTPATIENT_CLINIC_OR_DEPARTMENT_OTHER): Payer: Self-pay | Admitting: *Deleted

## 2012-10-17 DIAGNOSIS — E119 Type 2 diabetes mellitus without complications: Secondary | ICD-10-CM | POA: Insufficient documentation

## 2012-10-17 DIAGNOSIS — M109 Gout, unspecified: Secondary | ICD-10-CM

## 2012-10-17 DIAGNOSIS — M2540 Effusion, unspecified joint: Secondary | ICD-10-CM | POA: Insufficient documentation

## 2012-10-17 DIAGNOSIS — M779 Enthesopathy, unspecified: Secondary | ICD-10-CM

## 2012-10-17 DIAGNOSIS — M7650 Patellar tendinitis, unspecified knee: Secondary | ICD-10-CM | POA: Insufficient documentation

## 2012-10-17 DIAGNOSIS — Z79899 Other long term (current) drug therapy: Secondary | ICD-10-CM | POA: Insufficient documentation

## 2012-10-17 DIAGNOSIS — I10 Essential (primary) hypertension: Secondary | ICD-10-CM | POA: Insufficient documentation

## 2012-10-17 LAB — BASIC METABOLIC PANEL
BUN: 13 mg/dL (ref 6–23)
CO2: 26 mEq/L (ref 19–32)
Calcium: 9.5 mg/dL (ref 8.4–10.5)
Chloride: 104 mEq/L (ref 96–112)
Creatinine, Ser: 1 mg/dL (ref 0.50–1.35)
GFR calc Af Amer: >90 mL/min (ref >90)
GFR calc non Af Amer: 78 mL/min — ABNORMAL LOW (ref >90)
Glucose, Bld: 141 mg/dL — ABNORMAL HIGH (ref 70–99)
Potassium: 3.6 mEq/L (ref 3.5–5.1)
Sodium: 140 mEq/L (ref 135–145)

## 2012-10-17 MED ORDER — COLCHICINE 0.6 MG PO TABS: 0.6000 mg | ORAL_TABLET | Freq: Two times a day (BID) | ORAL | Status: DC | PRN

## 2012-10-17 NOTE — ED Provider Notes (Signed)
CSN: [id number]     Arrival date & time 10/17/12  0848 History   None    Chief Complaint  Patient presents with  . Knee Pain   (Consider location/radiation/quality/duration/timing/severity/associated sxs/prior Treatment) HPI  This is a 62 year old woman with a history of gout, diabetes, and hypertension who presents with right knee pain. Patient reports one week history of worsening right knee pain. He has a history of gout and has had gout in his knee before. He states this is similar. He denies any fevers or systemic symptoms. Patient took his colchicine with some improvement of his pain. He has run out of his colchicine. Patient states that he works [employer] and is on his knees a lot. He may have hurt his right knee this past week.  Past Medical History  Diagnosis Date  . Gout   . Diabetes mellitus   . Hypertension    Past Surgical History  Procedure Laterality Date  . Lung biopsy     No family history on file. History  Substance Use Topics  . Smoking status: Never Smoker   . Smokeless tobacco: Never Used  . Alcohol Use: No    Review of Systems  Constitutional: Negative.  Negative for fever and chills.  Respiratory: Negative.  Negative for chest tightness and shortness of breath.   Cardiovascular: Negative.  Negative for chest pain.  Gastrointestinal: Negative.  Negative for abdominal pain.  Genitourinary: Negative.  Negative for dysuria.  Musculoskeletal: Positive for joint swelling.       Right knee  Skin: Negative for color change and rash.  Neurological: Negative for headaches.  All other systems reviewed and are negative.    Allergies  Review of patient's allergies indicates no known allergies.  Home Medications   Current Outpatient Rx  Name  Route  Sig  Dispense  Refill  . acetaminophen (TYLENOL) 650 MG CR tablet   Oral   Take 650 mg by mouth every 6 (six) hours as needed. Every 4-6 hours PRN Pain.         . colchicine 0.6 MG tablet   Oral   Take  1 tablet (0.6 mg total) by mouth 2 (two) times daily as needed.   30 tablet   0   . finasteride (PROSCAR) 5 MG tablet   Oral   Take 5 mg by mouth at bedtime and may repeat dose one time if needed.         Marland Kitchen EXPIRED: losartan (COZAAR) 50 MG tablet   Oral   Take 1 tablet (50 mg total) by mouth daily.   90 tablet   1   . EXPIRED: pravastatin (PRAVACHOL) 40 MG tablet   Oral   Take 1 tablet (40 mg total) by mouth daily.   90 tablet   1    BP 152/86  Pulse 77  Temp(Src) 97.9 F (36.6 C) (Oral)  Resp 16  Ht 5\' 9"  (1.753 m)  Wt 210 lb (95.255 kg)  BMI 31 kg/m2  SpO2 98% Physical Exam  Nursing note and vitals reviewed. Constitutional: He is oriented to person, place, and time. He appears well-developed and well-nourished.  HENT:  Head: Normocephalic and atraumatic.  Cardiovascular: Normal rate, regular rhythm and normal heart sounds.   No murmur heard. Pulmonary/Chest: Effort normal and breath sounds normal. No respiratory distress.  Abdominal: Soft. Bowel sounds are normal. There is no tenderness. There is no rebound.  Musculoskeletal:  Focused examination of the right knee shows a moderate knee effusion. Patient has  full range of motion both passively and actively. There is no notable erythema or warmth. Distal pulses are intact.  Lymphadenopathy:    He has no cervical adenopathy.  Neurological: He is alert and oriented to person, place, and time.  Skin: Skin is warm and dry. No erythema.  Psychiatric: He has a normal mood and affect.    ED Course  Procedures (including critical care time) Labs Review Labs Reviewed  BASIC METABOLIC PANEL - Abnormal; Notable for the following:    Glucose, Bld 141 (*)    GFR calc non Af Amer 78 (*)    All other components within normal limits   Imaging Review Dg Knee 2 Views Right  10/17/2012   *RADIOLOGY REPORT*  Clinical Data: Anterior knee pain  RIGHT KNEE - 1-2 VIEW  Comparison: None.  Findings: Normal alignment without  fracture or effusion.  Anterior knee patellar tendon region soft tissue thickening noted.  This can be seen with tendonitis.  Preserved joint spaces.  No significant arthropathy.  IMPRESSION: Patellar tendon region soft tissue swelling.  No acute osseous finding.   Original Report Authenticated By: Judie Petit. Miles Costain, M.D.    MDM   1. Gout attack   2. Tendonitis      This is a 62 year old male with history of diabetes, hypertension, and gout who presents with right knee pain. Patient is well-appearing on exam and his vital signs are reassuring. Examination of the knee shows a moderate effusion without warmth or redness. Patient has full range of motion and I have low suspicion for septic joint given that he has no history of fever and is well-appearing systemically. Given his history of gout in that knee and colchicine improved his pain, my suspicion is this is a gout attack. I discussed with the patient that while I think he said low risk for infection, I cannot rule it out without tapping the joint.  He defers arthrocentesis at this time. Plain film was obtained. A BMP was obtained to evaluate the patient's kidney function for treatment of gout.  X-ray shows swelling around the patellar tendon that may represent tendinitis. At this time I feel patient's symptoms are likely secondary to gout and/or tendinitis, both of which would be treated with RICE  therapy and NSAIDs. Given the patient's age and reduce GFR, I feel coursing is the best was for his gout. Until immediate add ibuprofen every 6 hours for additional pain relief for the next few days in case there is also a tendinitis component. Patient stated understanding.  After history, exam, and medical workup I feel the patient has been appropriately medically screened and is safe for discharge home. Pertinent diagnoses were discussed with the patient. Patient was given return precautions.  Shon Baton, MD 10/17/12 [phone]

## 2012-10-17 NOTE — ED Notes (Signed)
Patient c/o R knee pain for the past week, since Thursday the pain and swelling has grown worse. Patient states he had fluid removed from his knee several years ago. Took some pills he had for gout but no relief

## 2012-10-17 NOTE — ED Notes (Signed)
Pain 9/10, throbbing, aggravate by movement, relieved by rest. Able to ambulate. Redness and swelling noted, painful at palpation. His job requires him be on his knee a lot, "I may have bump my knee on something"

## 2015-01-19 ENCOUNTER — Telehealth: Payer: Self-pay

## 2015-01-19 NOTE — Telephone Encounter (Signed)
Pre Visit call completed. 

## 2015-01-20 ENCOUNTER — Ambulatory Visit (INDEPENDENT_AMBULATORY_CARE_PROVIDER_SITE_OTHER): Payer: BLUE CROSS/BLUE SHIELD | Admitting: Medical

## 2015-01-20 ENCOUNTER — Ambulatory Visit (HOSPITAL_BASED_OUTPATIENT_CLINIC_OR_DEPARTMENT_OTHER)
Admission: RE | Admit: 2015-01-20 | Discharge: 2015-01-20 | Disposition: A | Discharge status: Home / Self Care | Payer: BLUE CROSS/BLUE SHIELD | Source: Ambulatory Visit | Attending: Medical | Admitting: Medical

## 2015-01-20 ENCOUNTER — Encounter: Payer: Self-pay | Admitting: Medical

## 2015-01-20 VITALS — BP 140/90 | HR 86 | Temp 98.0°F | Wt 220.0 lb

## 2015-01-20 DIAGNOSIS — M4186 Other forms of scoliosis, lumbar region: Secondary | ICD-10-CM | POA: Diagnosis not present

## 2015-01-20 DIAGNOSIS — M545 Low back pain, unspecified: Secondary | ICD-10-CM

## 2015-01-20 DIAGNOSIS — M47896 Other spondylosis, lumbar region: Secondary | ICD-10-CM | POA: Insufficient documentation

## 2015-01-20 DIAGNOSIS — I10 Essential (primary) hypertension: Secondary | ICD-10-CM | POA: Diagnosis not present

## 2015-01-20 DIAGNOSIS — E785 Hyperlipidemia, unspecified: Secondary | ICD-10-CM

## 2015-01-20 DIAGNOSIS — E1110 Type 2 diabetes mellitus with ketoacidosis without coma: Secondary | ICD-10-CM

## 2015-01-20 DIAGNOSIS — Z8739 Personal history of other diseases of the musculoskeletal system and connective tissue: Secondary | ICD-10-CM

## 2015-01-20 DIAGNOSIS — Z8639 Personal history of other endocrine, nutritional and metabolic disease: Secondary | ICD-10-CM | POA: Diagnosis not present

## 2015-01-20 DIAGNOSIS — E1310 Other specified diabetes mellitus with ketoacidosis without coma: Secondary | ICD-10-CM

## 2015-01-20 LAB — CBC WITH DIFFERENTIAL/PLATELET
Basophils Absolute: 0.1 10*3/uL (ref 0.0–0.1)
Basophils Relative: 1 % (ref 0–1)
Eosinophils Absolute: 0.1 10*3/uL (ref 0.0–0.7)
Eosinophils Relative: 2 % (ref 0–5)
HCT: 43.2 % (ref 39.0–52.0)
Hemoglobin: 14.3 g/dL (ref 13.0–17.0)
Lymphocytes Relative: 41 % (ref 12–46)
Lymphs Abs: 2.1 10*3/uL (ref 0.7–4.0)
MCH: 27.1 pg (ref 26.0–34.0)
MCHC: 33.1 g/dL (ref 30.0–36.0)
MCV: 82 fL (ref 78.0–100.0)
MPV: 11 fL (ref 8.6–12.4)
Monocytes Absolute: 0.5 10*3/uL (ref 0.1–1.0)
Monocytes Relative: 9 % (ref 3–12)
Neutro Abs: 2.4 10*3/uL (ref 1.7–7.7)
Neutrophils Relative %: 47 % (ref 43–77)
Platelets: 174 10*3/uL (ref 150–400)
RBC: 5.27 MIL/uL (ref 4.22–5.81)
RDW: 14.3 % (ref 11.5–15.5)
WBC: 5.1 10*3/uL (ref 4.0–10.5)

## 2015-01-20 LAB — COMPREHENSIVE METABOLIC PANEL
ALT: 22 U/L (ref 9–46)
AST: 26 U/L (ref 10–35)
Albumin: 4.6 g/dL (ref 3.6–5.1)
Alkaline Phosphatase: 69 U/L (ref 40–115)
BUN: 15 mg/dL (ref 7–25)
CO2: 26 mmol/L (ref 20–31)
Calcium: 9.6 mg/dL (ref 8.6–10.3)
Chloride: 96 mmol/L — ABNORMAL LOW (ref 98–110)
Creat: 1 mg/dL (ref 0.70–1.25)
Glucose, Bld: 106 mg/dL — ABNORMAL HIGH (ref 65–99)
Potassium: 3.8 mmol/L (ref 3.5–5.3)
Sodium: 136 mmol/L (ref 135–146)
Total Bilirubin: 0.4 mg/dL (ref 0.2–1.2)
Total Protein: 7.5 g/dL (ref 6.1–8.1)

## 2015-01-20 LAB — URIC ACID: Uric Acid, Serum: 8.4 mg/dL — ABNORMAL HIGH (ref 4.0–7.8)

## 2015-01-20 MED ORDER — CYCLOBENZAPRINE HCL 5 MG PO TABS: 5.0000 mg | ORAL_TABLET | Freq: Every day | ORAL | Status: DC

## 2015-01-20 NOTE — Assessment & Plan Note (Signed)
Will get uric acid level today. May need to start med for this again.

## 2015-01-20 NOTE — Assessment & Plan Note (Signed)
On follow up come in fasting so we can get lipid panel. Then may restart lipid med.

## 2015-01-20 NOTE — Progress Notes (Signed)
Subjective:    Patient ID: Russell Shannon, male    DOB: 1950-05-15, 64 y.o.   MRN: [id number]  HPI  I have reviewed pt PMH, PSH, FH, Social History and Surgical History.   Diabetes II- pt has not been on any medication for years. 2013 his a1-c was 6.4. Pt states back then provider told him can try diet and exercise only. At one point was insulin 1st inpast. Then they put him on tablets. Pt can't remember name of tabs. Pt then states he was last metformin.  Gout- pt used to be on medicine for gout. But has not been on it med for one year.  htn-Pt is on losartan and hctz. Pt bp initialy high when MA checked. Systolic better when I checked.  Hyperlipidemia- Pt has hx of this but years since being on any medication.  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  Pt lower back pain- Pain on and off. Hurts at work. Sometimes pain at night. Pain for about a month. At rest hurting recently. Sometimes worse at work.    Review of Systems  Constitutional: Negative for fever, chills, diaphoresis, activity change and fatigue.  Respiratory: Negative for cough, chest tightness and shortness of breath.   Cardiovascular: Negative for chest pain, palpitations and leg swelling.  Gastrointestinal: Negative for nausea, vomiting and abdominal pain.  Musculoskeletal: Negative for neck pain and neck stiffness.  Neurological: Negative for dizziness, tremors, seizures, syncope, facial asymmetry, speech difficulty, weakness, light-headedness, numbness and headaches.  Psychiatric/Behavioral: Negative for behavioral problems, confusion and agitation. The patient is not nervous/anxious.     Past Medical History  Diagnosis Date  . Gout   . Diabetes mellitus   . Hypertension   . Hyperlipidemia     Social History   Social History  . Marital Status: Married    Spouse Name: N/A  . Number of Children: N/A  . Years of  Education: N/A   Occupational History  . Not on file.   Social History Main Topics  . Smoking status: Never Smoker   . Smokeless tobacco: Never Used  . Alcohol Use: No  . Drug Use: No  . Sexual Activity: Yes   Other Topics Concern  . Not on file   Social History Narrative    Past Surgical History  Procedure Laterality Date  . Lung biopsy      Family History  Problem Relation Age of Onset  . Hypertension Mother   . Hypertension Father     No Known Allergies  Current Outpatient Prescriptions on File Prior to Visit  Medication Sig Dispense Refill  . hydrochlorothiazide (HYDRODIURIL) 25 MG tablet Take 25 mg by mouth daily.    . colchicine 0.6 MG tablet Take 1 tablet (0.6 mg total) by mouth 2 (two) times daily as needed. 30 tablet 0  . losartan (COZAAR) 50 MG tablet Take 1 tablet (50 mg total) by mouth daily. 90 tablet 1  . omeprazole (PRILOSEC) 20 MG capsule Take 20 mg by mouth as needed.    . pravastatin (PRAVACHOL) 40 MG tablet Take 1 tablet (40 mg total) by mouth daily. 90 tablet 1   No current facility-administered medications on file prior to visit.    BP 140/90 mmHg  Pulse 86  Temp(Src) 98 F (36.7 C)  Wt 220 lb (99.791 kg)  SpO2 96%       Objective:   Physical Exam  General Mental  Status- Alert. General Appearance- Not in acute distress.   Skin General: Color- Normal Color. Moisture- Normal Moisture.  Neck Carotid Arteries- Normal color. Moisture- Normal Moisture. No carotid bruits. No JVD.  Chest and Lung Exam Auscultation: Breath Sounds:-Normal.  Cardiovascular Auscultation:Rythm- Regular. Murmurs & Other Heart Sounds:Auscultation of the heart reveals- No Murmurs.  Abdomen Inspection:-Inspeection Normal. Palpation/Percussion:Note:No mass. Palpation and Percussion of the abdomen reveal- Non Tender, Non Distended + BS, no rebound or guarding.    Neurologic Cranial Nerve exam:- CN III-XII intact(No nystagmus), symmetric  smile. Strength:- 5/5 equal and symmetric strength both upper and lower extremities.   Back Mid lumbar no  spine tenderness to palpation presently. Pain on straight leg lift. Pain faont on ateral movements and flexion/extension of the spine.  Lower ext neurologic  L5-S1 sensation intact bilaterally. Normal patellar reflexes bilaterally. No foot drop bilaterally.      Assessment & Plan:  Continue on current bp med. Your level is borderline today. When back for follow up in 2 wks if still borderline will likely adjust bp med regimen.  Check cmp today.  Will get uric acid level today. May need to start med for this again.  On follow up come in fasting so we can get lipid panel.  For your diabetes will get a1-c today to see if need to start back on medication.  Follow up in 2 wks. Make early am appointment and come in fasting.  For your back pain. Would recommend low dose ibuprofen 200 mg every 8 hours. That way minimize effect on bp. Also will rx low dose flexeril to use at night. Also get xray of lumbar spine. Stretching exercises as well.

## 2015-01-20 NOTE — Assessment & Plan Note (Signed)
For your diabetes will get a1-c today to see if need to start back on medication.

## 2015-01-20 NOTE — Assessment & Plan Note (Signed)
Continue on current bp med(losartan and hctz). Your level is borderline today. When back for follow up in 2 wks if still borderline will likely adjust bp med regimen.  Check cmp today.

## 2015-01-20 NOTE — Patient Instructions (Addendum)
Continue on current bp med. Your level is borderline today. When back for follow up in 2 wks if still borderline will likely adjust bp med regimen.  Check cmp today.  Will get uric acid level today. May need to start med for this again.  On follow up come in fasting so we can get lipid panel.  For your diabetes will get a1-c today to see if need to start back on medication.  Follow up in 2 wks. Make early am appointment and come in fasting.  For your back pain. Would recommend low dose ibuprofen 200 mg every 8 hours. That way minimize effect on bp. Also will rx low dose flexeril to use at night. Also get xray of lumbar spine. Stretching exercises as well.

## 2015-01-20 NOTE — Progress Notes (Signed)
Pre visit review using our clinic review tool, if applicable. No additional management support is needed unless otherwise documented below in the visit note. 

## 2015-01-21 LAB — HEMOGLOBIN A1C
Hgb A1c MFr Bld: 7.7 % — ABNORMAL HIGH (ref <5.7)
Mean Plasma Glucose: 174 mg/dL — ABNORMAL HIGH (ref <117)

## 2015-01-22 ENCOUNTER — Telehealth: Payer: Self-pay | Admitting: Medical

## 2015-01-22 NOTE — Telephone Encounter (Signed)
Please see not I sent regarding his labs.

## 2015-01-23 ENCOUNTER — Other Ambulatory Visit: Payer: Self-pay

## 2015-01-23 MED ORDER — METFORMIN HCL 500 MG PO TABS: 500.0000 mg | ORAL_TABLET | Freq: Two times a day (BID) | ORAL | Status: DC

## 2015-01-23 MED ORDER — ALLOPURINOL 100 MG PO TABS: 100.0000 mg | ORAL_TABLET | Freq: Every day | ORAL | Status: DC

## 2015-01-23 NOTE — Telephone Encounter (Signed)
Patient has been notified of results.  

## 2015-01-23 NOTE — Telephone Encounter (Signed)
Left detailed message for pt to call back about refills.

## 2015-01-24 NOTE — Telephone Encounter (Signed)
Pt called back in returning your call.    CB: [phone]

## 2015-02-03 ENCOUNTER — Encounter: Payer: Self-pay | Admitting: Medical

## 2015-02-03 ENCOUNTER — Encounter: Payer: Self-pay | Admitting: Gastroenterology

## 2015-02-03 ENCOUNTER — Ambulatory Visit (INDEPENDENT_AMBULATORY_CARE_PROVIDER_SITE_OTHER): Payer: BLUE CROSS/BLUE SHIELD | Admitting: Medical

## 2015-02-03 VITALS — BP 130/84 | HR 70 | Temp 97.1°F | Ht 68.5 in | Wt 217.0 lb

## 2015-02-03 DIAGNOSIS — I10 Essential (primary) hypertension: Secondary | ICD-10-CM | POA: Diagnosis not present

## 2015-02-03 DIAGNOSIS — E1310 Other specified diabetes mellitus with ketoacidosis without coma: Secondary | ICD-10-CM | POA: Diagnosis not present

## 2015-02-03 DIAGNOSIS — Z23 Encounter for immunization: Secondary | ICD-10-CM | POA: Diagnosis not present

## 2015-02-03 DIAGNOSIS — Z8639 Personal history of other endocrine, nutritional and metabolic disease: Secondary | ICD-10-CM

## 2015-02-03 DIAGNOSIS — Z1211 Encounter for screening for malignant neoplasm of colon: Secondary | ICD-10-CM

## 2015-02-03 DIAGNOSIS — E1110 Type 2 diabetes mellitus with ketoacidosis without coma: Secondary | ICD-10-CM

## 2015-02-03 DIAGNOSIS — E785 Hyperlipidemia, unspecified: Secondary | ICD-10-CM | POA: Diagnosis not present

## 2015-02-03 DIAGNOSIS — Z8739 Personal history of other diseases of the musculoskeletal system and connective tissue: Secondary | ICD-10-CM

## 2015-02-03 LAB — LIPID PANEL
Cholesterol: 206 mg/dL — ABNORMAL HIGH (ref 0–200)
HDL: 35.9 mg/dL — ABNORMAL LOW (ref >39.00)
LDL Cholesterol: 143 mg/dL — ABNORMAL HIGH (ref 0–99)
NonHDL: 170.23
Total CHOL/HDL Ratio: 6
Triglycerides: 136 mg/dL (ref 0.0–149.0)
VLDL: 27.2 mg/dL (ref 0.0–40.0)

## 2015-02-03 NOTE — Progress Notes (Signed)
Pre visit review using our clinic review tool, if applicable. No additional management support is needed unless otherwise documented below in the visit note. 

## 2015-02-03 NOTE — Progress Notes (Signed)
Subjective:    Patient ID: Russell Shannon, male    DOB: March 05, 1950, 64 y.o.   MRN: [id number]  HPI  Pt in for follow up. His a1-c was 7.7 (2 wks ago). He plans to do more exercise, and he plans to eat better. He started metformin again. He reports no side effects to med.  Pt states also allopurinol. His uric acid was elevated. No arthralgia presently.  Pt bp is better today about 10 points less systolic. No cardiac or neurologic symptoms reported.  Pt has hyperlipidemia. He is fasting today. He is on pravastatin.     Review of Systems  Constitutional: Negative for fever, chills, diaphoresis, activity change and fatigue.  Respiratory: Negative for cough, chest tightness and shortness of breath.   Cardiovascular: Negative for chest pain, palpitations and leg swelling.  Gastrointestinal: Negative for nausea, vomiting and abdominal pain.  Musculoskeletal: Negative for neck pain and neck stiffness.  Neurological: Negative for dizziness, tremors, seizures, syncope, facial asymmetry, speech difficulty, weakness, light-headedness, numbness and headaches.  Psychiatric/Behavioral: Negative for behavioral problems, confusion and agitation. The patient is not nervous/anxious.     Past Medical History  Diagnosis Date  . Gout   . Diabetes mellitus   . Hypertension   . Hyperlipidemia     Social History   Social History  . Marital Status: Married    Spouse Name: N/A  . Number of Children: N/A  . Years of Education: N/A   Occupational History  . Not on file.   Social History Main Topics  . Smoking status: Never Smoker   . Smokeless tobacco: Never Used  . Alcohol Use: No  . Drug Use: No  . Sexual Activity: Yes   Other Topics Concern  . Not on file   Social History Narrative    Past Surgical History  Procedure Laterality Date  . Lung biopsy      Family History  Problem Relation Age of Onset  . Hypertension Mother   . Hypertension Father     No Known  Allergies  Current Outpatient Prescriptions on File Prior to Visit  Medication Sig Dispense Refill  . allopurinol (ZYLOPRIM) 100 MG tablet Take 1 tablet (100 mg total) by mouth daily. 30 tablet 3  . cyclobenzaprine (FLEXERIL) 5 MG tablet Take 1 tablet (5 mg total) by mouth at bedtime. 10 tablet 0  . hydrochlorothiazide (HYDRODIURIL) 25 MG tablet Take 25 mg by mouth daily.    . metFORMIN (GLUCOPHAGE) 500 MG tablet Take 1 tablet (500 mg total) by mouth 2 (two) times daily with a meal. 60 tablet 3  . omeprazole (PRILOSEC) 20 MG capsule Take 20 mg by mouth as needed.     No current facility-administered medications on file prior to visit.    BP 130/84 mmHg  Pulse 70  Temp(Src) 97.1 F (36.2 C) (Oral)  Ht 5' 8.5" (1.74 m)  Wt 217 lb (98.431 kg)  BMI 32.51 kg/m2  SpO2 98%       Objective:   Physical Exam  General Mental Status- Alert. General Appearance- Not in acute distress.   Skin General: Color- Normal Color. Moisture- Normal Moisture.  Neck Carotid Arteries- Normal color. Moisture- Normal Moisture. No carotid bruits. No JVD.  Chest and Lung Exam Auscultation: Breath Sounds:-Normal.  Cardiovascular Auscultation:Rythm- Regular. Murmurs & Other Heart Sounds:Auscultation of the heart reveals- No Murmurs.  Abdomen Inspection:-Inspeection Normal. Palpation/Percussion:Note:No mass. Palpation and Percussion of the abdomen reveal- Non Tender, Non Distended + BS, no rebound or guarding.  Neurologic Cranial Nerve exam:- CN III-XII intact(No nystagmus), symmetric smile. Strength:- 5/5 equal and symmetric strength both upper and lower extremities.      Assessment & Plan:  For diabetes- continue glucophage, diet and exercise. Repeat a1-c early march. Get urin microalbumin today. Refer to opthalmology for diabetic eye exam.  For htn- continue current bp meds.  For hyperlipidemia will get lipid panel today and see if needs adjustment to current regimen.  For gout  continue allopurinol.  Will get  psv 23 today and tdap  Follow up in March for diabetic exam follow up or as needed

## 2015-02-03 NOTE — Patient Instructions (Addendum)
For diabetes- continue glucophage, diet and exercise. Repeat a1-c early march. Get urin microalbumin today. Refer to opthalmology for diabetic eye exam.  For htn- continue current bp meds.  For hyperlipidemia will get lipid panel today and see if needs adjustment to current regimen.  For gout continue allopurinol.  Will get  psv 23 today and tdap  Follow up in March for diabetic exam follow up or as needed

## 2015-02-03 NOTE — Addendum Note (Signed)
Addended by: Neldon LabellaMABE, HOLDEN S on: 02/03/2015 10:01 AM   Modules accepted: Orders

## 2015-02-04 LAB — MICROALBUMIN, URINE: Microalb, Ur: 4.5 mg/dL

## 2015-02-09 ENCOUNTER — Telehealth: Payer: Self-pay | Admitting: Medical

## 2015-02-09 NOTE — Telephone Encounter (Signed)
Caller name: Self  Can be reached: [phone]  [facility]:  [facility] [address] [place] - [address] [phone] (Phone) [phone])         Reason for call: Pt requesting change in medication from allopurinol (ZYLOPRIM) 100 MG tablet to another medication because this one is not working

## 2015-02-09 NOTE — Telephone Encounter (Signed)
Patient checking on the status of medication request and just want to insure the dosage increase.

## 2015-02-09 NOTE — Telephone Encounter (Signed)
Edward please advise on note below.  

## 2015-02-09 NOTE — Telephone Encounter (Signed)
Pt states that he has stopped BP medications because it has made him very sick. He also states that he is in so much pain and he would like something to help. He states that he went to urgent care and he was advised to stop by the office and per ES can give him a pain medication if he does not get anything from the urgent care. He says he is hurting all over and just wants some relief.

## 2015-02-10 ENCOUNTER — Telehealth: Payer: Self-pay | Admitting: Medical

## 2015-02-10 NOTE — Telephone Encounter (Signed)
I talked with the doctor at urgent care yesterday who thought he had gout. He stated he would take care of patient gout and thus control the pain. We offered pt appointment this coming Wed to  review UC treatment and to recheck his bp. [place] was in [place] at time of the call. We have upcoming holiday weekend and closed on Monday. I won't be in office on Tuesday. So MA offered him Wednesday appointment.

## 2015-02-14 ENCOUNTER — Telehealth: Payer: Self-pay | Admitting: Medical

## 2015-02-14 NOTE — Telephone Encounter (Addendum)
Relation to RU:EAVWpt:self Call back number:[phone] [facility] [address] [place], [place] - [address] [phone] (Phone) [phone])         Reason for call:  Patient states hydrochlorothiazide (HYDRODIURIL) 25 MG tablet and allopurinol (ZYLOPRIM) 100 MG tablet is not working to its full pontential ,patient states the gout medication is not working and the blood pressure medication is making her sick.

## 2015-02-14 NOTE — Telephone Encounter (Signed)
I need to know first what blood pressure medication he is taking currently. In the body of my note on first time I saw him listed losartan and hctz. Hctz is on his medication list. But losartan is not?? Would you call and clarify this point. I need to know if he on losartan before makng any adjustment.  Also what changes did the urgent care make. What medications did they give him. The Urgent Care MD that I talked to mentioned that they thought he was having a gout attack. If that is the case I need what what written for the acute gout. Gout is to control chronic gout. Does not work for acute attacks. So need to know what he was given for acute phase.

## 2015-02-15 NOTE — Telephone Encounter (Signed)
Spoke with pt and he states that he has stopped his blood pressure medication and he has not taken losartan at all. He was given something for pain at the urgent care but he does not know what the medication that was given. Pt is coming for an appointment tomorrow evening and will discuss further treatment with PCP.

## 2015-02-16 ENCOUNTER — Encounter: Payer: Self-pay | Admitting: Medical

## 2015-02-16 ENCOUNTER — Ambulatory Visit (INDEPENDENT_AMBULATORY_CARE_PROVIDER_SITE_OTHER): Payer: BLUE CROSS/BLUE SHIELD | Admitting: Medical

## 2015-02-16 VITALS — BP 130/80 | HR 92 | Temp 98.1°F | Ht 68.5 in | Wt 212.8 lb

## 2015-02-16 DIAGNOSIS — M10071 Idiopathic gout, right ankle and foot: Secondary | ICD-10-CM | POA: Diagnosis not present

## 2015-02-16 DIAGNOSIS — M2550 Pain in unspecified joint: Secondary | ICD-10-CM

## 2015-02-16 DIAGNOSIS — M109 Gout, unspecified: Secondary | ICD-10-CM

## 2015-02-16 MED ORDER — HYDROCODONE-ACETAMINOPHEN 5-325 MG PO TABS: 1.0000 | ORAL_TABLET | Freq: Four times a day (QID) | ORAL | Status: DC | PRN

## 2015-02-16 MED ORDER — GLYBURIDE 5 MG PO TABS: 5.0000 mg | ORAL_TABLET | Freq: Every day | ORAL | Status: DC

## 2015-02-16 MED ORDER — PREDNISONE 20 MG PO TABS: ORAL_TABLET | ORAL | Status: DC

## 2015-02-16 NOTE — Progress Notes (Signed)
Subjective:    Patient ID: Russell Shannon, male    DOB: January 31, 1951, 64 y.o.   MRN: [id number]  HPI  Pt is in with let elbow pain since past Tuesday. Then Thursday he went to the UC out of town. Pt was given colchicine and pain medication.  Since he had elbow pain he also has some rt great toe pain.  Pt was taking 3 tab a day of colchicine. Then he started taking twice a day. But gout has worsened. Pt had no blood work done in urgent.  Pt blood sugar this morning was 95. Pt taking is taking the metformin.  Pt blood pressure is good today. Pt is not on any hctz.. Also in past he was on losartan.  Review of Systems  Constitutional: Negative for fever, chills and fatigue.  Respiratory: Negative for cough, choking, shortness of breath and wheezing.   Musculoskeletal:       Rt foot and left elbow pain  Skin: Negative for rash.  Neurological: Negative for dizziness, syncope, weakness, numbness and headaches.  Hematological: Negative for adenopathy. Does not bruise/bleed easily.  Psychiatric/Behavioral: Negative for behavioral problems and confusion.   Past Medical History  Diagnosis Date  . Gout   . Diabetes mellitus   . Hypertension   . Hyperlipidemia     Social History   Social History  . Marital Status: Married    Spouse Name: N/A  . Number of Children: N/A  . Years of Education: N/A   Occupational History  . Not on file.   Social History Main Topics  . Smoking status: Never Smoker   . Smokeless tobacco: Never Used  . Alcohol Use: No  . Drug Use: No  . Sexual Activity: Yes   Other Topics Concern  . Not on file   Social History Narrative    Past Surgical History  Procedure Laterality Date  . Lung biopsy      Family History  Problem Relation Age of Onset  . Hypertension Mother   . Hypertension Father     No Known Allergies  Current Outpatient Prescriptions on File Prior to Visit  Medication Sig Dispense Refill  . allopurinol (ZYLOPRIM) 100 MG  tablet Take 1 tablet (100 mg total) by mouth daily. 30 tablet 3  . cyclobenzaprine (FLEXERIL) 5 MG tablet Take 1 tablet (5 mg total) by mouth at bedtime. 10 tablet 0  . metFORMIN (GLUCOPHAGE) 500 MG tablet Take 1 tablet (500 mg total) by mouth 2 (two) times daily with a meal. 60 tablet 3  . omeprazole (PRILOSEC) 20 MG capsule Take 20 mg by mouth as needed.     No current facility-administered medications on file prior to visit.    BP 130/80 mmHg  Pulse 92  Temp(Src) 98.1 F (36.7 C) (Oral)  Ht 5' 8.5" (1.74 m)  Wt 212 lb 12.8 oz (96.525 kg)  BMI 31.88 kg/m2  SpO2 99%       Objective:   Physical Exam   General- No acute distress. Pleasant patient. Neck- Full range of motion, no jvd Lungs- Clear, even and unlabored. Heart- regular rate and rhythm. Neurologic- CNII- XII grossly intact.  Lt elbow- mild tender to palpation. But no swelling. No warmth. Good rom. Rt great toe- podagra type pain. Joint mild swollen and warm. But extreme pain even to light tough.      Assessment & Plan:  For your gout I have decided to give you tapered prednisone. I do this hesitantly but you did not  have response to colchicine. Instead you got worse. Stop colchicine presently. Rx  Norco.  I want you to get cbc and uric acid tomorrow in am  For your diabetes continue metformin and watch your sugar intake while on the prednisone. Also will rx glyburide short term.  Follow up Friday morning next week or sooner.

## 2015-02-16 NOTE — Progress Notes (Signed)
Pre visit review using our clinic review tool, if applicable. No additional management support is needed unless otherwise documented below in the visit note. 

## 2015-02-16 NOTE — Patient Instructions (Addendum)
For your gout I have decided to give you tapered prednisone. I do this hesitantly but you did not have response to colchicine. Instead you got worse. Stop colchicine presently. Rx  Norco.  I want you to get cbc and uric acid tomorrow in am  For your diabetes continue metformin and watch your sugar intake while on the prednisone. Also will rx glyburide short term.  Follow up Friday morning next week or sooner.  Pt educated on benefit vs side effect of prednisone. He will watch his sugars closely.

## 2015-02-17 ENCOUNTER — Other Ambulatory Visit (INDEPENDENT_AMBULATORY_CARE_PROVIDER_SITE_OTHER): Payer: BLUE CROSS/BLUE SHIELD

## 2015-02-17 DIAGNOSIS — M2550 Pain in unspecified joint: Secondary | ICD-10-CM

## 2015-02-17 LAB — CBC WITH DIFFERENTIAL/PLATELET
Basophils Absolute: 0 10*3/uL (ref 0.0–0.1)
Basophils Relative: 0.5 % (ref 0.0–3.0)
Eosinophils Absolute: 0 10*3/uL (ref 0.0–0.7)
Eosinophils Relative: 0.5 % (ref 0.0–5.0)
HCT: 44.1 % (ref 39.0–52.0)
Hemoglobin: 14 g/dL (ref 13.0–17.0)
Lymphocytes Relative: 18 % (ref 12.0–46.0)
Lymphs Abs: 1.4 10*3/uL (ref 0.7–4.0)
MCHC: 31.7 g/dL (ref 30.0–36.0)
MCV: 83.6 fl (ref 78.0–100.0)
Monocytes Absolute: 0.1 10*3/uL (ref 0.1–1.0)
Monocytes Relative: 1.9 % — ABNORMAL LOW (ref 3.0–12.0)
Neutro Abs: 6 10*3/uL (ref 1.4–7.7)
Neutrophils Relative %: 79.1 % — ABNORMAL HIGH (ref 43.0–77.0)
Platelets: 215 10*3/uL (ref 150.0–400.0)
RBC: 5.27 Mil/uL (ref 4.22–5.81)
RDW: 13.8 % (ref 11.5–15.5)
WBC: 7.6 10*3/uL (ref 4.0–10.5)

## 2015-02-17 LAB — URIC ACID: Uric Acid, Serum: 6.3 mg/dL (ref 4.0–7.8)

## 2015-02-21 ENCOUNTER — Other Ambulatory Visit: Payer: BLUE CROSS/BLUE SHIELD

## 2015-02-24 ENCOUNTER — Ambulatory Visit: Payer: BLUE CROSS/BLUE SHIELD | Admitting: Medical

## 2015-03-02 ENCOUNTER — Telehealth: Payer: Self-pay | Admitting: Medical

## 2015-03-02 NOTE — Telephone Encounter (Signed)
[facility] [address] - [place], [place] - [address]  Reason for call: Pt working out of town and said that he is having a gout flare up in his arm and he can't even touch his face. He had pills for it and wants more. He described taking a prednisone pack but said he had another medicine for it also. He said his foot is better but his arm is not. Pt is working out of town. He states to call at 12:00pm today or a few minutes after if you need to talk to him bc that is when he is on break.

## 2015-03-02 NOTE — Telephone Encounter (Signed)
Yesterday was long day and apologize that was unable to call him. Please call him first thing on Friday am. Is he coming back in town before the weekend. Would prefer to see him in office since he is diabetic with gout and may need prednisone for recent flares. Would want to know that his sugars are controlled before writng taper dose.  Colchicine is option. But he failed tx with this recently. I also may refer him to rheumatologist. Also he may need narcotic. But if [place] is in [place]. i can't call that in.

## 2015-03-02 NOTE — Telephone Encounter (Signed)
Please see note below and advise  

## 2015-03-03 ENCOUNTER — Encounter: Payer: Self-pay | Admitting: Medical

## 2015-03-03 ENCOUNTER — Ambulatory Visit (INDEPENDENT_AMBULATORY_CARE_PROVIDER_SITE_OTHER): Payer: BLUE CROSS/BLUE SHIELD | Admitting: Medical

## 2015-03-03 VITALS — BP 142/82 | HR 99 | Temp 97.5°F | Resp 16 | Ht 68.5 in | Wt 210.0 lb

## 2015-03-03 DIAGNOSIS — R739 Hyperglycemia, unspecified: Secondary | ICD-10-CM

## 2015-03-03 DIAGNOSIS — M25522 Pain in left elbow: Secondary | ICD-10-CM | POA: Diagnosis not present

## 2015-03-03 DIAGNOSIS — M109 Gout, unspecified: Secondary | ICD-10-CM

## 2015-03-03 DIAGNOSIS — M10071 Idiopathic gout, right ankle and foot: Secondary | ICD-10-CM

## 2015-03-03 MED ORDER — HYDROCODONE-ACETAMINOPHEN 5-325 MG PO TABS: 1.0000 | ORAL_TABLET | Freq: Four times a day (QID) | ORAL | Status: DC | PRN

## 2015-03-03 MED ORDER — PREDNISONE 20 MG PO TABS: ORAL_TABLET | ORAL | Status: DC

## 2015-03-03 MED ORDER — COLCHICINE 0.6 MG PO TABS
0.6000 mg | ORAL_TABLET | Freq: Two times a day (BID) | ORAL | Status: DC
Start: 2015-03-03 — End: 2015-07-21

## 2015-03-03 MED ORDER — FEBUXOSTAT 40 MG PO TABS: 40.0000 mg | ORAL_TABLET | Freq: Every day | ORAL | Status: DC

## 2015-03-03 NOTE — Telephone Encounter (Signed)
Left message for pt to call back to schedule an appointment this afternoon if he is in town.

## 2015-03-03 NOTE — Progress Notes (Signed)
Pre visit review using our clinic review tool, if applicable. No additional management support is needed unless otherwise documented below in the visit note/SLS  

## 2015-03-03 NOTE — Telephone Encounter (Signed)
[place]. Pt is scheduled to be there until February. Pt said best # is [phone]. Please call between 12:00-12:30pm during his lunch break. Or leave msg and he can call back on his break.

## 2015-03-03 NOTE — Telephone Encounter (Signed)
Scheduled for 3:45 pm today.

## 2015-03-03 NOTE — Progress Notes (Signed)
Subjective:    Patient ID: Russell Shannon, male    DOB: 09-Jan-1951, 65 y.o.   MRN: [id number]  HPI   Pt in with left elbow pain. Pt rt great toe is hurting as well. Pt elbow started hurting on Monday.   Pt states last time I saw him for gout and gave prednisone. It helped a lot an quickly. Before that colchicine did not help.  Pt has not checked his blood sugar today. On last gout flare when taking his prednisone one day his sugar went to 324. MA checked today and was just above 200.  Pt a1-c a month ago was 7.7.  No fever chills or sweats.      Review of Systems  Constitutional: Negative for fever, chills and fatigue.  Respiratory: Negative for cough, chest tightness, shortness of breath and wheezing.   Cardiovascular: Negative for chest pain and palpitations.  Musculoskeletal: Negative for back pain.       See hpi.  Skin: Negative for pallor.  Hematological: Negative for adenopathy. Does not bruise/bleed easily.   Past Medical History  Diagnosis Date  . Gout   . Diabetes mellitus   . Hypertension   . Hyperlipidemia     Social History   Social History  . Marital Status: Married    Spouse Name: N/A  . Number of Children: N/A  . Years of Education: N/A   Occupational History  . Not on file.   Social History Main Topics  . Smoking status: Never Smoker   . Smokeless tobacco: Never Used  . Alcohol Use: No  . Drug Use: No  . Sexual Activity: Yes   Other Topics Concern  . Not on file   Social History Narrative    Past Surgical History  Procedure Laterality Date  . Lung biopsy      Family History  Problem Relation Age of Onset  . Hypertension Mother   . Hypertension Father     No Known Allergies  Current Outpatient Prescriptions on File Prior to Visit  Medication Sig Dispense Refill  . cyclobenzaprine (FLEXERIL) 5 MG tablet Take 1 tablet (5 mg total) by mouth at bedtime. 10 tablet 0  . glyBURIDE (DIABETA) 5 MG tablet Take 1 tablet (5 mg  total) by mouth daily with breakfast. 30 tablet 0  . metFORMIN (GLUCOPHAGE) 500 MG tablet Take 1 tablet (500 mg total) by mouth 2 (two) times daily with a meal. 60 tablet 3  . omeprazole (PRILOSEC) 20 MG capsule Take 20 mg by mouth as needed.     No current facility-administered medications on file prior to visit.    BP 142/82 mmHg  Pulse 99  Temp(Src) 97.5 F (36.4 C) (Oral)  Resp 16  Ht 5' 8.5" (1.74 m)  Wt 210 lb (95.255 kg)  BMI 31.46 kg/m2  SpO2 97%       Objective:   Physical Exam  General- No acute distress. Pleasant patient. Neck- Full range of motion, no jvd Lungs- Clear, even and unlabored. Heart- regular rate and rhythm. Neurologic- CNII- XII grossly intact.  Lt elbow- mild swollen and tender. Pain flexion and extension Rt great toe-  Podagra location for pain. Moderate pain.       Assessment & Plan:  For probable gout  start 3 day taper prednisone and colchicine daily.  Norco for severe pain.  I wrote uloric but don't take this yet since you are in flare. But do want to see cost of med. May start in near  future.  Srict diabetic diet and take meds since you will be on prednisone  Follow up in 1 wk or as needed.  Plan to get labs on follow up and assess uric acid and examen joint before deciding when to start uloric.

## 2015-03-03 NOTE — Patient Instructions (Addendum)
For probable gout  start 3 day taper prednisone and colchicine daily.  Norco for severe pain.  I wrote uloric but don't take this yet since you are in flare. But do want to see cost of med. May start in near future.  Srict diabetic diet and take meds since you will be on prednisone  Follow up in 1 wk or as needed.

## 2015-03-06 ENCOUNTER — Telehealth: Payer: Self-pay | Admitting: *Deleted

## 2015-03-06 NOTE — Telephone Encounter (Signed)
PA approved through [facility]. JG//CMA

## 2015-03-07 LAB — GLUCOSE, POCT (MANUAL RESULT ENTRY): POC Glucose: 205 mg/dl — AB (ref 70–99)

## 2015-03-07 NOTE — Addendum Note (Signed)
Addended by: Regis Bill on: 03/07/2015 11:56 AM   Modules accepted: Orders

## 2015-03-10 ENCOUNTER — Ambulatory Visit: Payer: BLUE CROSS/BLUE SHIELD | Admitting: Medical

## 2015-04-07 ENCOUNTER — Encounter: Payer: BLUE CROSS/BLUE SHIELD | Admitting: Gastroenterology

## 2015-04-21 ENCOUNTER — Ambulatory Visit: Payer: BLUE CROSS/BLUE SHIELD | Admitting: Medical

## 2015-07-04 ENCOUNTER — Encounter: Payer: Self-pay | Admitting: Family Medicine

## 2015-07-04 ENCOUNTER — Ambulatory Visit (INDEPENDENT_AMBULATORY_CARE_PROVIDER_SITE_OTHER): Payer: BLUE CROSS/BLUE SHIELD | Admitting: Family Medicine

## 2015-07-04 VITALS — BP 148/90 | HR 97 | Temp 98.1°F | Ht 68.5 in | Wt 215.4 lb

## 2015-07-04 DIAGNOSIS — M545 Low back pain, unspecified: Secondary | ICD-10-CM

## 2015-07-04 MED ORDER — HYDROCODONE-ACETAMINOPHEN 5-325 MG PO TABS: 1.0000 | ORAL_TABLET | Freq: Four times a day (QID) | ORAL | Status: DC | PRN

## 2015-07-04 MED ORDER — CYCLOBENZAPRINE HCL 10 MG PO TABS: 10.0000 mg | ORAL_TABLET | Freq: Three times a day (TID) | ORAL | Status: DC | PRN

## 2015-07-04 NOTE — Patient Instructions (Signed)

## 2015-07-04 NOTE — Progress Notes (Signed)
Patient ID: Russell Shannon, male    DOB: 10/14/1950  Age: 65 y.o. MRN: [id number]    Subjective:  Subjective HPI Russell Shannon presents for low back pain since Thursday night while in bed.  Pt states he fell about 1 month ago at work but he did not get hurt.  He has been having pain in L hip for over a year that would come and go ---- it has progressively gotten worse and Thursday night it became really bad -- 10/10 and remained that bad over weekend.  [facility] [place] and was given tramadol and flexeril with little relief.   Pt has not lost control of his bowels or bladder.   No weakness in the legs.    Review of Systems  Constitutional: Negative for diaphoresis, appetite change, fatigue and unexpected weight change.  Eyes: Negative for pain, redness and visual disturbance.  Respiratory: Negative for cough, chest tightness, shortness of breath and wheezing.   Cardiovascular: Negative for chest pain, palpitations and leg swelling.  Endocrine: Negative for cold intolerance, heat intolerance, polydipsia, polyphagia and polyuria.  Genitourinary: Negative for dysuria, frequency and difficulty urinating.  Musculoskeletal: Positive for back pain. Negative for gait problem, neck pain and neck stiffness.  Neurological: Positive for weakness. Negative for dizziness, light-headedness, numbness and headaches.    History Past Medical History  Diagnosis Date  . Gout   . Diabetes mellitus   . Hypertension   . Hyperlipidemia     He has past surgical history that includes Lung biopsy.   His family history includes Hypertension in his father and mother.He reports that he has never smoked. He has never used smokeless tobacco. He reports that he does not drink alcohol or use illicit drugs.  Current Outpatient Prescriptions on File Prior to Visit  Medication Sig Dispense Refill  . colchicine 0.6 MG tablet Take 1 tablet (0.6 mg total) by mouth 2 (two) times daily. 60 tablet 1  .  cyclobenzaprine (FLEXERIL) 5 MG tablet Take 1 tablet (5 mg total) by mouth at bedtime. 10 tablet 0  . febuxostat (ULORIC) 40 MG tablet Take 1 tablet (40 mg total) by mouth daily. 14 tablet 0  . metFORMIN (GLUCOPHAGE) 500 MG tablet Take 1 tablet (500 mg total) by mouth 2 (two) times daily with a meal. 60 tablet 3  . omeprazole (PRILOSEC) 20 MG capsule Take 20 mg by mouth as needed.    . glyBURIDE (DIABETA) 5 MG tablet Take 1 tablet (5 mg total) by mouth daily with breakfast. (Patient not taking: Reported on 07/04/2015) 30 tablet 0   No current facility-administered medications on file prior to visit.     Objective:  Objective Physical Exam  Musculoskeletal: He exhibits tenderness.  Neurological:  Weakness in L hip flexor and low leg dtr dec b/l  + straight leg on L   Nursing note and vitals reviewed.  BP 148/90 mmHg  Pulse 97  Temp(Src) 98.1 F (36.7 C) (Oral)  Ht 5' 8.5" (1.74 m)  Wt 215 lb 6.4 oz (97.705 kg)  BMI 32.27 kg/m2  SpO2 96% Wt Readings from Last 3 Encounters:  07/04/15 215 lb 6.4 oz (97.705 kg)  03/03/15 210 lb (95.255 kg)  02/16/15 212 lb 12.8 oz (96.525 kg)     Lab Results  Component Value Date   WBC 7.6 02/17/2015   HGB 14.0 02/17/2015   HCT 44.1 02/17/2015   PLT 215.0 02/17/2015   GLUCOSE 106* 01/20/2015   CHOL 206* 02/03/2015  TRIG 136.0 02/03/2015   HDL 35.90* 02/03/2015   LDLCALC 143* 02/03/2015   ALT 22 01/20/2015   AST 26 01/20/2015   NA 136 01/20/2015   K 3.8 01/20/2015   CL 96* 01/20/2015   CREATININE 1.00 01/20/2015   BUN 15 01/20/2015   CO2 26 01/20/2015   PSA 9.52* 01/11/2011   HGBA1C 7.7* 01/20/2015   MICROALBUR 4.5 02/03/2015    Dg Lumbar Spine 2-3 Views  01/20/2015  CLINICAL DATA:  Low back pain for the past month. EXAM: LUMBAR SPINE - 2-3 VIEW COMPARISON:  None. FINDINGS: Five non rib-bearing lumbar vertebrae with unfused L1 transverse processes. Thoracolumbar kyphosis. Mild levoconvex lumbar rotary scoliosis. Mild to moderate  anterior and lateral spur formation at multiple levels. No fractures, pars defects or subluxations. IMPRESSION: Degenerative changes and mild scoliosis. Electronically Signed   By: Beckie Salts M.D.   On: 01/20/2015 17:21     Assessment & Plan:  Plan I have discontinued Russell Shannon's predniSONE. I am also having him start on cyclobenzaprine. Additionally, I am having him maintain his omeprazole, cyclobenzaprine, metFORMIN, glyBURIDE, febuxostat, colchicine, traMADol, and HYDROcodone-acetaminophen.  Meds ordered this encounter  Medications  . traMADol (ULTRAM) 50 MG tablet    Sig: Take 50 mg by mouth 4 (four) times daily as needed.  Marland Kitchen HYDROcodone-acetaminophen (NORCO) 5-325 MG tablet    Sig: Take 1 tablet by mouth every 6 (six) hours as needed for moderate pain.    Dispense:  30 tablet    Refill:  0  . cyclobenzaprine (FLEXERIL) 10 MG tablet    Sig: Take 1 tablet (10 mg total) by mouth 3 (three) times daily as needed for muscle spasms.    Dispense:  30 tablet    Refill:  0    Problem List Items Addressed This Visit    None    Visit Diagnoses    Bilateral low back pain without sciatica    -  Primary    Relevant Medications    traMADol (ULTRAM) 50 MG tablet    HYDROcodone-acetaminophen (NORCO) 5-325 MG tablet    cyclobenzaprine (FLEXERIL) 10 MG tablet    Other Relevant Orders    DG Lumbar Spine Complete     warm compresses, rest and rto 2 weeks or sooner prn Follow-up: Return in about 2 weeks (around 07/18/2015), or if symptoms worsen or fail to improve.  Donato Schultz, DO

## 2015-07-04 NOTE — Progress Notes (Signed)
Pre visit review using our clinic review tool, if applicable. No additional management support is needed unless otherwise documented below in the visit note. 

## 2015-07-05 ENCOUNTER — Ambulatory Visit (HOSPITAL_BASED_OUTPATIENT_CLINIC_OR_DEPARTMENT_OTHER)
Admission: RE | Admit: 2015-07-05 | Discharge: 2015-07-05 | Disposition: A | Discharge status: Home / Self Care | Payer: BLUE CROSS/BLUE SHIELD | Source: Ambulatory Visit | Attending: Family Medicine | Admitting: Family Medicine

## 2015-07-05 DIAGNOSIS — I700 Atherosclerosis of aorta: Secondary | ICD-10-CM | POA: Diagnosis not present

## 2015-07-05 DIAGNOSIS — M545 Low back pain, unspecified: Secondary | ICD-10-CM

## 2015-07-05 DIAGNOSIS — M4186 Other forms of scoliosis, lumbar region: Secondary | ICD-10-CM | POA: Insufficient documentation

## 2015-07-18 ENCOUNTER — Encounter: Payer: Self-pay | Admitting: Medical

## 2015-07-18 ENCOUNTER — Ambulatory Visit: Payer: BLUE CROSS/BLUE SHIELD | Admitting: Medical

## 2015-07-18 ENCOUNTER — Ambulatory Visit (INDEPENDENT_AMBULATORY_CARE_PROVIDER_SITE_OTHER): Payer: BLUE CROSS/BLUE SHIELD | Admitting: Medical

## 2015-07-18 VITALS — BP 134/88 | HR 103 | Temp 98.6°F | Ht 68.5 in | Wt 211.0 lb

## 2015-07-18 DIAGNOSIS — E119 Type 2 diabetes mellitus without complications: Secondary | ICD-10-CM

## 2015-07-18 DIAGNOSIS — M109 Gout, unspecified: Secondary | ICD-10-CM | POA: Diagnosis not present

## 2015-07-18 DIAGNOSIS — M5441 Lumbago with sciatica, right side: Secondary | ICD-10-CM

## 2015-07-18 DIAGNOSIS — R7989 Other specified abnormal findings of blood chemistry: Secondary | ICD-10-CM

## 2015-07-18 DIAGNOSIS — E790 Hyperuricemia without signs of inflammatory arthritis and tophaceous disease: Secondary | ICD-10-CM

## 2015-07-18 MED ORDER — DICLOFENAC SODIUM 75 MG PO TBEC: 75.0000 mg | DELAYED_RELEASE_TABLET | Freq: Two times a day (BID) | ORAL | Status: DC

## 2015-07-18 NOTE — Addendum Note (Signed)
Addended by: Eustace QuailEABOLD, Mariana Wiederholt J on: 07/18/2015 03:18 PM   Modules accepted: Orders

## 2015-07-18 NOTE — Progress Notes (Signed)
Pre visit review using our clinic review tool, if applicable. No additional management support is needed unless otherwise documented below in the visit note. 

## 2015-07-18 NOTE — Progress Notes (Signed)
Subjective:    Patient ID: Lenon OmsWilliam Fosnaugh, male    DOB: 11-07-1950, 65 y.o.   MRN: [id number]  HPI  Pt in with some report of residual back pain. Before was 10/10 when he saw Dr. Laury AxonLowne. He states about on 8th day of hydrocodone and flexeril Pain came down a whole lot. Now only 1/10 level pain at past. No saddle anesthesia. No leg weakness, no foot drop.  Pt states no pain shooting to legs. Some faint pain rt SI area.  Pt also has history of gout. I had written uloric in the past. He states one pharmacy was reasonable. The other pharmacy charge him $300. I had not written him allopurinol in past since he had some recent flares. Pt does still notice occasional joint pain and swelling. In left elbow and rt heel last week but none now.  Pt has diabetes. Some time since follow up. He missed follow appointments. Pt is still on metformin.    Review of Systems  Constitutional: Negative for fever, chills and fatigue.  Respiratory: Negative for cough, chest tightness and wheezing.   Cardiovascular: Negative for chest pain and palpitations.  Gastrointestinal: Negative for nausea, vomiting and abdominal pain.  Genitourinary: Negative for dysuria, urgency, hematuria and flank pain.  Musculoskeletal: Positive for back pain.  Skin: Negative for rash.  Neurological: Negative for weakness and numbness.  Hematological: Negative for adenopathy. Does not bruise/bleed easily.  Psychiatric/Behavioral: Negative for behavioral problems and confusion.    Past Medical History  Diagnosis Date  . Gout   . Diabetes mellitus   . Hypertension   . Hyperlipidemia      Social History   Social History  . Marital Status: Married    Spouse Name: N/A  . Number of Children: N/A  . Years of Education: N/A   Occupational History  . Not on file.   Social History Main Topics  . Smoking status: Never Smoker   . Smokeless tobacco: Never Used  . Alcohol Use: No  . Drug Use: No  . Sexual Activity: Yes    Other Topics Concern  . Not on file   Social History Narrative    Past Surgical History  Procedure Laterality Date  . Lung biopsy      Family History  Problem Relation Age of Onset  . Hypertension Mother   . Hypertension Father     No Known Allergies  Current Outpatient Prescriptions on File Prior to Visit  Medication Sig Dispense Refill  . colchicine 0.6 MG tablet Take 1 tablet (0.6 mg total) by mouth 2 (two) times daily. 60 tablet 1  . cyclobenzaprine (FLEXERIL) 5 MG tablet Take 1 tablet (5 mg total) by mouth at bedtime. 10 tablet 0  . HYDROcodone-acetaminophen (NORCO) 5-325 MG tablet Take 1 tablet by mouth every 6 (six) hours as needed for moderate pain. 30 tablet 0  . metFORMIN (GLUCOPHAGE) 500 MG tablet Take 1 tablet (500 mg total) by mouth 2 (two) times daily with a meal. 60 tablet 3   No current facility-administered medications on file prior to visit.    BP 134/88 mmHg  Pulse 103  Temp(Src) 98.6 F (37 C) (Oral)  Ht 5' 8.5" (1.74 m)  Wt 211 lb (95.709 kg)  BMI 31.61 kg/m2  SpO2 100%       Objective:   Physical Exam   General Appearance- Not in acute distress.    Chest and Lung Exam Auscultation: Breath sounds:-Normal. Clear even and unlabored. Adventitious sounds:- No Adventitious sounds.  Cardiovascular Auscultation:Rythm - Regular, rate and rythm. Heart Sounds -Normal heart sounds.  Abdomen Inspection:-Inspection Normal.  Palpation/Perucssion: Palpation and Percussion of the abdomen reveal- Non Tender, No Rebound tenderness, No rigidity(Guarding) and No Palpable abdominal masses.  Liver:-Normal.  Spleen:- Normal.   Back Rt side rt si tenderness to palpation. No lumbar spine tenderness to palpation. Pain on straight leg lift.(in rt si area) Pain on lateral movements and flexion/extension of the spine.  Lower ext neurologic  L5-S1 sensation intact bilaterally. Normal patellar reflexes bilaterally. No foot drop bilaterally.  Feet-  see quality metrics.     Assessment & Plan:  For you back pain will rx short course of diclofenac. If pain tries to re-flare. You do have some degenerative changes to back and some sciatica type features.  For gout will recheck your uric acid. Diclofenac could help with mild gout flares as well. After level back may decide to put you on allopurinol or uloric.  Will get a1c today and check urine microalbumin. Refill metformin accordingly. May need to adjust dose or add new med.  For hyperlipidemia. We need to check level on next visit. Can't do today since you just ate before you came in.  Follow up in 3 month sooner depending on lab results/how you do as well.

## 2015-07-18 NOTE — Patient Instructions (Addendum)
For you back pain will rx short course of diclofenac. If pain tries to re-flare. You do have some degenerative changes to back and some sciatica type features.  For gout will recheck your uric acid. Diclofenac could help with mild gout flares as well. After level back may decide to put you on allopurinol or uloric.  Will get a1c today and check urine microalbumin. Refill metformin accordingly. May need to adjust dose or add new med.  For hyperlipidemia. We need to check level on next visit. Can't do today since you just ate before you came in.  Follow up in 3 month sooner depending on lab results/how you do as well.

## 2015-07-18 NOTE — Addendum Note (Signed)
Addended by: Gwenevere AbbotSAGUIER, Taejon Irani M on: 07/18/2015 03:11 PM   Modules accepted: Orders

## 2015-07-19 LAB — HEMOGLOBIN A1C: Hgb A1c MFr Bld: 8 % — ABNORMAL HIGH (ref 4.6–6.5)

## 2015-07-19 LAB — COMPREHENSIVE METABOLIC PANEL
ALT: 17 U/L (ref 0–53)
AST: 19 U/L (ref 0–37)
Albumin: 4.5 g/dL (ref 3.5–5.2)
Alkaline Phosphatase: 78 U/L (ref 39–117)
BUN: 13 mg/dL (ref 6–23)
CO2: 28 mEq/L (ref 19–32)
Calcium: 9.9 mg/dL (ref 8.4–10.5)
Chloride: 104 mEq/L (ref 96–112)
Creatinine, Ser: 1.2 mg/dL (ref 0.40–1.50)
GFR: 77.87 mL/min (ref >60.00)
Glucose, Bld: 101 mg/dL — ABNORMAL HIGH (ref 70–99)
Potassium: 4 mEq/L (ref 3.5–5.1)
Sodium: 139 mEq/L (ref 135–145)
Total Bilirubin: 0.3 mg/dL (ref 0.2–1.2)
Total Protein: 7.8 g/dL (ref 6.0–8.3)

## 2015-07-19 LAB — URIC ACID: Uric Acid, Serum: 9.7 mg/dL — ABNORMAL HIGH (ref 4.0–7.8)

## 2015-07-21 ENCOUNTER — Telehealth: Payer: Self-pay | Admitting: Medical

## 2015-07-21 MED ORDER — METFORMIN HCL 500 MG PO TABS: ORAL_TABLET | ORAL | Status: DC

## 2015-07-21 MED ORDER — FEBUXOSTAT 40 MG PO TABS: 40.0000 mg | ORAL_TABLET | Freq: Every day | ORAL | Status: DC

## 2015-07-21 MED ORDER — COLCHICINE 0.6 MG PO TABS: 0.6000 mg | ORAL_TABLET | Freq: Two times a day (BID) | ORAL | Status: DC

## 2015-07-21 NOTE — Addendum Note (Signed)
Addended by: Neldon LabellaMABE, HOLDEN S on: 07/21/2015 04:46 PM   Modules accepted: Orders

## 2015-07-21 NOTE — Telephone Encounter (Signed)
Spoke with pt and he was notified that he would need to come back for a repeat Uric Acid level in 2 weeks and he voices understanding. Pt has a lab appointment for 07/31/15 for a repeat uric acid. Pt did not have any further questions.

## 2015-07-21 NOTE — Telephone Encounter (Signed)
Also advise pt that want to check uric acid in 2 wks. See if uric acid  level has dropped. May need to increase dose of uloric at 2 weeks.

## 2015-07-28 ENCOUNTER — Telehealth: Payer: Self-pay | Admitting: *Deleted

## 2015-07-28 ENCOUNTER — Other Ambulatory Visit (INDEPENDENT_AMBULATORY_CARE_PROVIDER_SITE_OTHER): Payer: BLUE CROSS/BLUE SHIELD

## 2015-07-28 DIAGNOSIS — R7989 Other specified abnormal findings of blood chemistry: Secondary | ICD-10-CM | POA: Diagnosis not present

## 2015-07-28 DIAGNOSIS — E790 Hyperuricemia without signs of inflammatory arthritis and tophaceous disease: Secondary | ICD-10-CM

## 2015-07-28 LAB — URIC ACID: Uric Acid, Serum: 7.8 mg/dL (ref 4.0–7.8)

## 2015-07-28 NOTE — Telephone Encounter (Signed)
PA initiated on [email or web address], awaiting determination. JG//CMA 

## 2015-07-28 NOTE — Addendum Note (Signed)
Addended by: Neldon LabellaMABE, HOLDEN S on: 07/28/2015 03:04 PM   Modules accepted: Orders

## 2015-07-28 NOTE — Telephone Encounter (Signed)
PA approved effective from 07/28/2015 through 02/17/2038. JG//CMA

## 2015-07-31 ENCOUNTER — Other Ambulatory Visit: Payer: BLUE CROSS/BLUE SHIELD

## 2015-08-14 ENCOUNTER — Other Ambulatory Visit: Payer: BLUE CROSS/BLUE SHIELD

## 2015-10-20 ENCOUNTER — Ambulatory Visit (INDEPENDENT_AMBULATORY_CARE_PROVIDER_SITE_OTHER): Payer: BLUE CROSS/BLUE SHIELD | Admitting: Medical

## 2015-10-20 ENCOUNTER — Ambulatory Visit: Payer: BLUE CROSS/BLUE SHIELD | Admitting: Medical

## 2015-10-20 ENCOUNTER — Encounter: Payer: Self-pay | Admitting: Medical

## 2015-10-20 VITALS — BP 160/100 | HR 67 | Temp 97.5°F | Wt 214.6 lb

## 2015-10-20 DIAGNOSIS — I10 Essential (primary) hypertension: Secondary | ICD-10-CM

## 2015-10-20 DIAGNOSIS — E119 Type 2 diabetes mellitus without complications: Secondary | ICD-10-CM | POA: Diagnosis not present

## 2015-10-20 DIAGNOSIS — Z1211 Encounter for screening for malignant neoplasm of colon: Secondary | ICD-10-CM

## 2015-10-20 DIAGNOSIS — E785 Hyperlipidemia, unspecified: Secondary | ICD-10-CM

## 2015-10-20 DIAGNOSIS — M109 Gout, unspecified: Secondary | ICD-10-CM | POA: Diagnosis not present

## 2015-10-20 DIAGNOSIS — Z23 Encounter for immunization: Secondary | ICD-10-CM

## 2015-10-20 MED ORDER — LOSARTAN POTASSIUM 100 MG PO TABS: 100.0000 mg | ORAL_TABLET | Freq: Every day | ORAL | 0 refills | Status: DC

## 2015-10-20 NOTE — Patient Instructions (Addendum)
For your diabetes will continue metformin. Will get a1-c in 2 weeks when in for follow up.  For high cholesterol will get lipid panel fasting as well in 2 weeks.  For high bp will rx losartan. Recheck bp in 2 weeks.  For gout get uric acid in 2 weeks. Continue colcrys.  Gave flu vaccine and psv 13 today.  Refer for colonoscopy.  [place]. Please get copy of that for us please.  Follow up 2 weeks or as needed

## 2015-10-20 NOTE — Progress Notes (Signed)
Subjective:    Patient ID: Russell Shannon, male    DOB: 07-05-1950, 65 y.o.   MRN: [id number]  HPI  Pt in follow up.  Pt bp initially high on check today. Pt has not got bp cuff yet. He states he should. Pt has not been on bp medication in the past. Though bp has been fluctuating/hovering around 140/90 in past.  Pt  Past a1-c was 8.0. Pt on metformin. Pt has not been exercising. Pt is still working out of town.   Pt is willing to go to get colonoscopy now.  Pt states he has not eye exam. He states will get one done [place].  Pt willing to get flu vaccine today.  Pt states his gout has been controlled. He can't remember last flare was. Pt take colcrys as need for flare if feels slight pain and seems to resolve any potential gout early. Marland Kitchen He stared uloric and then never followed up at 2 wks.        Review of Systems  Constitutional: Negative for chills, fatigue and fever.  Respiratory: Negative for cough, shortness of breath and wheezing.   Cardiovascular: Negative for chest pain and palpitations.  Gastrointestinal: Negative for abdominal pain.  Musculoskeletal: Negative for arthralgias, back pain and gait problem.  Skin: Negative for rash.  Neurological: Negative for dizziness, speech difficulty, weakness, numbness and headaches.  Hematological: Negative for adenopathy. Does not bruise/bleed easily.  Psychiatric/Behavioral: Negative for behavioral problems and confusion.   Past Medical History:  Diagnosis Date  . Diabetes mellitus   . Gout   . Hyperlipidemia   . Hypertension      Social History   Social History  . Marital status: Married    Spouse name: N/A  . Number of children: N/A  . Years of education: N/A   Occupational History  . Not on file.   Social History Main Topics  . Smoking status: Never Smoker  . Smokeless tobacco: Never Used  . Alcohol use No  . Drug use: No  . Sexual activity: Yes   Other Topics Concern  . Not on file   Social  History Narrative  . No narrative on file    Past Surgical History:  Procedure Laterality Date  . LUNG BIOPSY      Family History  Problem Relation Age of Onset  . Hypertension Mother   . Hypertension Father     No Known Allergies  Current Outpatient Prescriptions on File Prior to Visit  Medication Sig Dispense Refill  . colchicine 0.6 MG tablet Take 1 tablet (0.6 mg total) by mouth 2 (two) times daily. 60 tablet 1  . cyclobenzaprine (FLEXERIL) 5 MG tablet Take 1 tablet (5 mg total) by mouth at bedtime. 10 tablet 0  . diclofenac (VOLTAREN) 75 MG EC tablet Take 1 tablet (75 mg total) by mouth 2 (two) times daily. 20 tablet 0  . febuxostat (ULORIC) 40 MG tablet Take 1 tablet (40 mg total) by mouth daily. 14 tablet 0  . HYDROcodone-acetaminophen (NORCO) 5-325 MG tablet Take 1 tablet by mouth every 6 (six) hours as needed for moderate pain. 30 tablet 0  . metFORMIN (GLUCOPHAGE) 500 MG tablet 2 tab po bid 120 tablet 3  . omeprazole (PRILOSEC) 20 MG capsule Take 20 mg by mouth daily.     No current facility-administered medications on file prior to visit.     BP (!) 160/100 (BP Location: Left Arm, Patient Position: Sitting, Cuff Size: Normal)  Pulse 67   Temp 97.5 F (36.4 C) (Oral)   Wt 214 lb 9.6 oz (97.3 kg)   SpO2 97%   BMI 32.16 kg/m       Objective:   Physical Exam  General Mental Status- Alert. General Appearance- Not in acute distress.   Skin General: Color- Normal Color. Moisture- Normal Moisture.  Neck Carotid Arteries- Normal color. Moisture- Normal Moisture. No carotid bruits. No JVD.  Chest and Lung Exam Auscultation: Breath Sounds:-Normal.  Cardiovascular Auscultation:Rythm- Regular. Murmurs & Other Heart Sounds:Auscultation of the heart reveals- No Murmurs.  Abdomen Inspection:-Inspeection Normal. Palpation/Percussion:Note:No mass. Palpation and Percussion of the abdomen reveal- Non Tender, Non Distended + BS, no rebound or  guarding.  Neurologic Cranial Nerve exam:- CN III-XII intact(No nystagmus), symmetric smile. Strength:- 5/5 equal and symmetric strength both upper and lower extremities.      Assessment & Plan:  For your diabetes will continue metformin. Will get a1-c in 2 weeks when in for follow up.  For high cholesterol will get lipid panel fasting as well in 2 weeks.  For high bp will rx losartan. Recheck bp in 2 weeks.  For gout get uric acid in 2 weeks. Continue colcrys.  Gave flu vaccine and psv 13 today.  Refer for colonoscopy.  [place] next week in [place]. Please get copy of that for us please.  Follow up 2 weeks or as needed

## 2015-10-20 NOTE — Progress Notes (Signed)
Pre visit review using our clinic review tool, if applicable. No additional management support is needed unless otherwise documented below in the visit note. 

## 2015-11-03 ENCOUNTER — Encounter: Payer: Self-pay | Admitting: Internal Medicine

## 2015-11-03 ENCOUNTER — Telehealth: Payer: Self-pay | Admitting: Medical

## 2015-11-03 ENCOUNTER — Encounter: Payer: Self-pay | Admitting: Medical

## 2015-11-03 ENCOUNTER — Ambulatory Visit (INDEPENDENT_AMBULATORY_CARE_PROVIDER_SITE_OTHER): Payer: BLUE CROSS/BLUE SHIELD | Admitting: Medical

## 2015-11-03 ENCOUNTER — Telehealth: Payer: Self-pay

## 2015-11-03 VITALS — BP 140/80 | HR 75 | Temp 97.5°F | Ht 69.0 in | Wt 209.4 lb

## 2015-11-03 DIAGNOSIS — Z23 Encounter for immunization: Secondary | ICD-10-CM | POA: Diagnosis not present

## 2015-11-03 DIAGNOSIS — Z8639 Personal history of other endocrine, nutritional and metabolic disease: Secondary | ICD-10-CM

## 2015-11-03 DIAGNOSIS — E785 Hyperlipidemia, unspecified: Secondary | ICD-10-CM

## 2015-11-03 DIAGNOSIS — I10 Essential (primary) hypertension: Secondary | ICD-10-CM

## 2015-11-03 DIAGNOSIS — M62830 Muscle spasm of back: Secondary | ICD-10-CM

## 2015-11-03 DIAGNOSIS — R7989 Other specified abnormal findings of blood chemistry: Secondary | ICD-10-CM | POA: Diagnosis not present

## 2015-11-03 DIAGNOSIS — Z8739 Personal history of other diseases of the musculoskeletal system and connective tissue: Secondary | ICD-10-CM

## 2015-11-03 DIAGNOSIS — E790 Hyperuricemia without signs of inflammatory arthritis and tophaceous disease: Secondary | ICD-10-CM

## 2015-11-03 DIAGNOSIS — E119 Type 2 diabetes mellitus without complications: Secondary | ICD-10-CM | POA: Diagnosis not present

## 2015-11-03 LAB — COMPREHENSIVE METABOLIC PANEL
ALT: 17 U/L (ref 0–53)
AST: 21 U/L (ref 0–37)
Albumin: 4.3 g/dL (ref 3.5–5.2)
Alkaline Phosphatase: 56 U/L (ref 39–117)
BUN: 14 mg/dL (ref 6–23)
CO2: 31 mEq/L (ref 19–32)
Calcium: 9.4 mg/dL (ref 8.4–10.5)
Chloride: 106 mEq/L (ref 96–112)
Creatinine, Ser: 1 mg/dL (ref 0.40–1.50)
GFR: 96.02 mL/min (ref >60.00)
Glucose, Bld: 119 mg/dL — ABNORMAL HIGH (ref 70–99)
Potassium: 4.2 mEq/L (ref 3.5–5.1)
Sodium: 141 mEq/L (ref 135–145)
Total Bilirubin: 0.4 mg/dL (ref 0.2–1.2)
Total Protein: 7.4 g/dL (ref 6.0–8.3)

## 2015-11-03 LAB — URIC ACID: Uric Acid, Serum: 7.8 mg/dL (ref 4.0–7.8)

## 2015-11-03 LAB — LIPID PANEL
Cholesterol: 206 mg/dL — ABNORMAL HIGH (ref 0–200)
HDL: 40.1 mg/dL (ref >39.00)
LDL Cholesterol: 148 mg/dL — ABNORMAL HIGH (ref 0–99)
NonHDL: 165.87
Total CHOL/HDL Ratio: 5
Triglycerides: 87 mg/dL (ref 0.0–149.0)
VLDL: 17.4 mg/dL (ref 0.0–40.0)

## 2015-11-03 LAB — HEMOGLOBIN A1C: Hgb A1c MFr Bld: 6.7 % — ABNORMAL HIGH (ref 4.6–6.5)

## 2015-11-03 MED ORDER — LOSARTAN POTASSIUM 100 MG PO TABS
100.0000 mg | ORAL_TABLET | Freq: Every day | ORAL | 3 refills | Status: DC
Start: 2015-11-03 — End: 2016-04-01

## 2015-11-03 MED ORDER — LOSARTAN POTASSIUM 100 MG PO TABS: 100.0000 mg | ORAL_TABLET | Freq: Every day | ORAL | 3 refills | Status: DC

## 2015-11-03 MED ORDER — CYCLOBENZAPRINE HCL 5 MG PO TABS: 5.0000 mg | ORAL_TABLET | Freq: Every day | ORAL | 0 refills | Status: DC

## 2015-11-03 NOTE — Telephone Encounter (Addendum)
Will you refill his losartan generic 100 mg #30 1 tab po q day. 2 refills. Epic won't let me send rx?? Thanks.  Disregard above. Finally able to write.

## 2015-11-03 NOTE — Telephone Encounter (Signed)
Refilled Losartan per patiient request.

## 2015-11-03 NOTE — Progress Notes (Signed)
Subjective:    Patient ID: Russell Shannon, male    DOB: 23-Nov-1950, 65 y.o.   MRN: [id number]  HPI   Pt in for follow up. His bp was high on last visit(started losartan). Pt states today when he checked bp 123/74 with his machine. He got new machine. Reading here initially high by lpn. No side effects with med losartan. No cardiac or neurologic signs and symptoms.  Pt mentions most of time when he checks. Majority of at home bp checks less than 140/90.  Pt will get a1c today as well. Last time in was too early to check.   Also pt will get uric acid today.   Pt is fasting today so lipid panel can be checked.    Review of Systems  Constitutional: Negative for chills and fatigue.  HENT: Negative for congestion, drooling and ear pain.   Respiratory: Negative for chest tightness, shortness of breath and wheezing.   Cardiovascular: Negative for chest pain and palpitations.  Musculoskeletal: Negative for back pain.  Skin: Negative for rash.  Neurological: Negative for dizziness, numbness and headaches.  Hematological: Negative for adenopathy. Does not bruise/bleed easily.  Psychiatric/Behavioral: Negative for behavioral problems and confusion.    Past Medical History:  Diagnosis Date  . Diabetes mellitus   . Gout   . Hyperlipidemia   . Hypertension      Social History   Social History  . Marital status: Married    Spouse name: N/A  . Number of children: N/A  . Years of education: N/A   Occupational History  . Not on file.   Social History Main Topics  . Smoking status: Never Smoker  . Smokeless tobacco: Never Used  . Alcohol use No  . Drug use: No  . Sexual activity: Yes   Other Topics Concern  . Not on file   Social History Narrative  . No narrative on file    Past Surgical History:  Procedure Laterality Date  . LUNG BIOPSY      Family History  Problem Relation Age of Onset  . Hypertension Mother   . Hypertension Father     No Known  Allergies  Current Outpatient Prescriptions on File Prior to Visit  Medication Sig Dispense Refill  . colchicine 0.6 MG tablet Take 1 tablet (0.6 mg total) by mouth 2 (two) times daily. 60 tablet 1  . cyclobenzaprine (FLEXERIL) 5 MG tablet Take 1 tablet (5 mg total) by mouth at bedtime. 10 tablet 0  . febuxostat (ULORIC) 40 MG tablet Take 1 tablet (40 mg total) by mouth daily. 14 tablet 0  . losartan (COZAAR) 100 MG tablet Take 1 tablet (100 mg total) by mouth daily. 30 tablet 0  . metFORMIN (GLUCOPHAGE) 500 MG tablet 2 tab po bid 120 tablet 3  . omeprazole (PRILOSEC) 20 MG capsule Take 20 mg by mouth daily.    . diclofenac (VOLTAREN) 75 MG EC tablet Take 1 tablet (75 mg total) by mouth 2 (two) times daily. (Patient not taking: Reported on 11/03/2015) 20 tablet 0  . HYDROcodone-acetaminophen (NORCO) 5-325 MG tablet Take 1 tablet by mouth every 6 (six) hours as needed for moderate pain. (Patient not taking: Reported on 11/03/2015) 30 tablet 0   No current facility-administered medications on file prior to visit.     BP 140/80 Comment: lt arm espac  Pulse 75   Temp 97.5 F (36.4 C) (Oral)   Ht 5\' 9"  (1.753 m)   Wt 209 lb 6.4 oz (95  kg)   SpO2 97%   BMI 30.92 kg/m       Objective:   Physical Exam  General Mental Status- Alert. General Appearance- Not in acute distress.   Skin General: Color- Normal Color. Moisture- Normal Moisture.  Neck Carotid Arteries- Normal color. Moisture- Normal Moisture. No carotid bruits. No JVD.  Chest and Lung Exam Auscultation: Breath Sounds:-Normal.  Cardiovascular Auscultation:Rythm- Regular. Murmurs & Other Heart Sounds:Auscultation of the heart reveals- No Murmurs.  Abdomen Inspection:-Inspeection Normal. Palpation/Percussion:Note:No mass. Palpation and Percussion of the abdomen reveal- Non Tender, Non Distended + BS, no rebound or guarding.    Neurologic Cranial Nerve exam:- CN III-XII intact(No nystagmus), symmetric smile. Drift  Test:- No drift. Finger to Nose:- Normal/Intact Strength:- 5/5 equal and symmetric strength both upper and lower extremities.      Assessment & Plan:  Your bp is better today and at home checks are better. Will rx more losartan. Keep checking your bp about every other day and as needed if you feel bad. Want to see consistently less than 140/90.  For your diabetes, high cholesterol and gout will get labs placed on last visit. Lipid pane, a1-c and uric acid  Follow up date to be determined after lab review or as needed  For reported back spasm at night related to work and bending over for hours will rx very limited supply of low dose flexril only use at night.  I sent refill request of losartan to lpn and rn. Epic woud not let me fill?   Aydin Cavalieri, Ramon DredgeEdward, PA-C

## 2015-11-03 NOTE — Addendum Note (Signed)
Addended by: Harley AltoPRICE, Ladarren Steiner M on: 11/03/2015 10:00 AM   Modules accepted: Orders

## 2015-11-03 NOTE — Patient Instructions (Addendum)
Your bp is better today and at home checks are better. Will rx more losartan. Keep checking your bp about every other day and as needed if you feel bad. Want to see consistently less than 140/90.  For your diabetes, high cholesterol and gout will get labs placed on last visit. Lipid pane, a1-c and uric acid  Follow up date to be determined after lab review or as needed  For reported back spasm at night related to work and bending over for hours will rx very limited supply of low dose flexril only use at night.

## 2015-11-03 NOTE — Telephone Encounter (Signed)
I sent you a message on statin rx I was sending pt in. But when I was going to rx the statin a warning came up with colchine and statins. So I want to talk with patient first before I decide if will rx the statin. If you get him on the phone  Can discuss this with him. Hopefully you will get this message before you get result note.

## 2015-11-06 NOTE — Telephone Encounter (Signed)
Left message on patients answering machine regarding speaking with Ramon DredgeEdward about medication.

## 2015-11-15 ENCOUNTER — Telehealth: Payer: Self-pay | Admitting: Medical

## 2015-11-15 MED ORDER — SIMVASTATIN 20 MG PO TABS: 20.0000 mg | ORAL_TABLET | Freq: Every day | ORAL | 0 refills | Status: DC

## 2015-11-15 NOTE — Telephone Encounter (Signed)
Sent rx of simvastatin and colcrys to pt pharmacy.

## 2015-11-23 ENCOUNTER — Telehealth: Payer: Self-pay | Admitting: Medical

## 2015-11-23 NOTE — Telephone Encounter (Signed)
Caller name: Relationship to patient: Self Can be reached: [phone]  Pharmacy:  [id number] [place], [place] - [address] [facility] [phone] (Phone) [phone])     Reason for call: Request refill on losartan (COZAAR) 100 MG tablet [[id number]

## 2015-11-30 ENCOUNTER — Other Ambulatory Visit: Payer: Self-pay

## 2015-11-30 NOTE — Telephone Encounter (Signed)
Called patient. Advised he should have refills on medication requested. States he will call Pharmacy.

## 2015-12-18 ENCOUNTER — Ambulatory Visit (AMBULATORY_SURGERY_CENTER): Payer: Self-pay | Admitting: *Deleted

## 2015-12-18 ENCOUNTER — Telehealth: Payer: Self-pay | Admitting: Internal Medicine

## 2015-12-18 VITALS — Ht 69.0 in | Wt 208.8 lb

## 2015-12-18 DIAGNOSIS — Z1211 Encounter for screening for malignant neoplasm of colon: Secondary | ICD-10-CM

## 2015-12-18 MED ORDER — NA SULFATE-K SULFATE-MG SULF 17.5-3.13-1.6 GM/177ML PO SOLN
1.0000 | Freq: Once | ORAL | 0 refills | Status: AC
Start: 2015-12-18 — End: 2015-12-18

## 2015-12-18 NOTE — Progress Notes (Signed)
No allergies to eggs or soy. No problems with anesthesia.  Pt not given Emmi instructions for colonoscopy; no computer access  No oxygen use  No diet drug use  

## 2015-12-18 NOTE — Telephone Encounter (Signed)
Spoke with patient. He's aware to come to 4th floor to pick up work note.

## 2016-01-01 ENCOUNTER — Encounter: Payer: Self-pay | Admitting: Internal Medicine

## 2016-01-01 ENCOUNTER — Ambulatory Visit (AMBULATORY_SURGERY_CENTER): Payer: BLUE CROSS/BLUE SHIELD | Admitting: Internal Medicine

## 2016-01-01 VITALS — BP 144/90 | HR 61 | Temp 97.1°F | Resp 12 | Ht 69.0 in | Wt 208.0 lb

## 2016-01-01 DIAGNOSIS — Z1211 Encounter for screening for malignant neoplasm of colon: Secondary | ICD-10-CM

## 2016-01-01 DIAGNOSIS — Z1212 Encounter for screening for malignant neoplasm of rectum: Secondary | ICD-10-CM | POA: Diagnosis not present

## 2016-01-01 MED ORDER — SODIUM CHLORIDE 0.9 % IV SOLN: 500.0000 mL | INTRAVENOUS | Status: AC

## 2016-01-01 NOTE — Patient Instructions (Signed)
YOU HAD AN ENDOSCOPIC PROCEDURE TODAY AT THE [facility]:   Refer to the procedure report that was given to you for any specific questions about what was found during the examination.  If the procedure report does not answer your questions, please call your gastroenterologist to clarify.  If you requested that your care partner not be given the details of your procedure findings, then the procedure report has been included in a sealed envelope for you to review at your convenience later.  YOU SHOULD EXPECT: Some feelings of bloating in the abdomen. Passage of more gas than usual.  Walking can help get rid of the air that was put into your GI tract during the procedure and reduce the bloating. If you had a lower endoscopy (such as a colonoscopy or flexible sigmoidoscopy) you may notice spotting of blood in your stool or on the toilet paper. If you underwent a bowel prep for your procedure, you may not have a normal bowel movement for a few days.  Please Note:  You might notice some irritation and congestion in your nose or some drainage.  This is from the oxygen used during your procedure.  There is no need for concern and it should clear up in a day or so.  SYMPTOMS TO REPORT IMMEDIATELY:   Following lower endoscopy (colonoscopy or flexible sigmoidoscopy):  Excessive amounts of blood in the stool  Significant tenderness or worsening of abdominal pains  Swelling of the abdomen that is new, acute  Fever of 100F or higher  For urgent or emergent issues, a gastroenterologist can be reached at any hour by calling [phone].   DIET:  We do recommend a small meal at first, but then you may proceed to your regular diet.  Drink plenty of fluids but you should avoid alcoholic beverages for 24 hours.  ACTIVITY:  You should plan to take it easy for the rest of today and you should NOT DRIVE or use heavy machinery until tomorrow (because of the sedation medicines used during the test).     FOLLOW UP: Our staff will call the number listed on your records the next business day following your procedure to check on you and address any questions or concerns that you may have regarding the information given to you following your procedure. If we do not reach you, we will leave a message.  However, if you are feeling well and you are not experiencing any problems, there is no need to return our call.  We will assume that you have returned to your regular daily activities without incident.  If any biopsies were taken you will be contacted by phone or by letter within the next 1-3 weeks.  Please call us at [phone] if you have not heard about the biopsies in 3 weeks.    SIGNATURES/CONFIDENTIALITY: You and/or your care partner have signed paperwork which will be entered into your electronic medical record.  These signatures attest to the fact that that the information above on your After Visit Summary has been reviewed and is understood.  Full responsibility of the confidentiality of this discharge information lies with you and/or your care-partner. 

## 2016-01-01 NOTE — Progress Notes (Signed)
Report given to PACU RN, vss 

## 2016-01-01 NOTE — Op Note (Signed)
[facility] Patient Name: Russell Shannon Procedure Date: 01/01/2016 8:01 AM MRN: [id number] Endoscopist: Wilhemina BonitoJohn N. Marina GoodellPerry , MD Age: [age] Referring MD:  Date of Birth: 01-18-1951 Gender: Male Account #: [id number] Procedure:                Colonoscopy Indications:              Screening for colorectal malignant neoplasm.                            Patient reports negative exam elsewhere 10+ years                            ago. No details Medicines:                Monitored Anesthesia Care Procedure:                Pre-Anesthesia Assessment:                           - Prior to the procedure, a History and Physical                            was performed, and patient medications and                            allergies were reviewed. The patient's tolerance of                            previous anesthesia was also reviewed. The risks                            and benefits of the procedure and the sedation                            options and risks were discussed with the patient.                            All questions were answered, and informed consent                            was obtained. Prior Anticoagulants: The patient has                            taken no previous anticoagulant or antiplatelet                            agents. ASA Grade Assessment: II - A patient with                            mild systemic disease. After reviewing the risks                            and benefits, the patient was deemed in  satisfactory condition to undergo the procedure.                           After obtaining informed consent, the colonoscope                            was passed under direct vision. Throughout the                            procedure, the patient's blood pressure, pulse, and                            oxygen saturations were monitored continuously. The                            Model CF-HQ190L [id number]) scope was  introduced                            through the anus and advanced to the the cecum,                            identified by appendiceal orifice and ileocecal                            valve. The ileocecal valve, appendiceal orifice,                            and rectum were photographed. The quality of the                            bowel preparation was excellent. The colonoscopy                            was performed without difficulty. The patient                            tolerated the procedure well. The bowel preparation                            used was SUPREP. Scope In: 8:10:44 AM Scope Out: 8:22:46 AM Scope Withdrawal Time: 0 hours 9 minutes 59 seconds  Total Procedure Duration: 0 hours 12 minutes 2 seconds  Findings:                 The entire examined colon appeared normal on direct                            and retroflexion views. Complications:            No immediate complications. Estimated blood loss:                            None. Estimated Blood Loss:     Estimated blood loss: none. Impression:               - The entire examined colon  is normal on direct and                            retroflexion views.                           - No specimens collected. Recommendation:           - Repeat colonoscopy in 10 years for screening                            purposes.                           - Patient has a contact number available for                            emergencies. The signs and symptoms of potential                            delayed complications were discussed with the                            patient. Return to normal activities tomorrow.                            Written discharge instructions were provided to the                            patient.                           - Resume previous diet.                           - Continue present medications. Wilhemina BonitoJohn N. Marina GoodellPerry, MD 01/01/2016 8:33:03 AM This report has been signed  electronically.

## 2016-01-02 ENCOUNTER — Telehealth: Payer: Self-pay

## 2016-01-02 NOTE — Telephone Encounter (Signed)
  Follow up Call-  Call back number 01/01/2016  Post procedure Call Back phone  # #[phone] cell  Permission to leave phone message Yes  Some recent data might be hidden     Patient questions:  Do you have a fever, pain , or abdominal swelling? No. Pain Score  0 *  Have you tolerated food without any problems? Yes.    Have you been able to return to your normal activities? Yes.    Do you have any questions about your discharge instructions: Diet   No. Medications  No. Follow up visit  No.  Do you have questions or concerns about your Care? No.  Actions: * If pain score is 4 or above: No action needed, pain <4.

## 2016-01-10 ENCOUNTER — Telehealth: Payer: Self-pay | Admitting: Medical

## 2016-01-10 NOTE — Telephone Encounter (Signed)
[facility] Cal[facility]

## 2016-01-10 NOTE — Telephone Encounter (Signed)
Noted, thank you

## 2016-01-10 NOTE — Telephone Encounter (Signed)
If back pain not improved since last seen in September, recommend follow-up office visit.

## 2016-01-10 NOTE — Telephone Encounter (Signed)
Relation to JX:BJYNpt:self Call back [phone] Pharmacy: [id number] - [place], [place] - [address] [facility] [address] [phone] (Phone) [phone] (Fax)     Reason for call:  Patient requesting "musle relaxer" and "pain medication" for back pain patient states PCP has prescribed before but patient cant recall the name of medication. Please advise

## 2016-01-12 ENCOUNTER — Ambulatory Visit (INDEPENDENT_AMBULATORY_CARE_PROVIDER_SITE_OTHER): Payer: BLUE CROSS/BLUE SHIELD | Admitting: Family

## 2016-01-12 DIAGNOSIS — G8929 Other chronic pain: Secondary | ICD-10-CM | POA: Diagnosis not present

## 2016-01-12 DIAGNOSIS — M545 Low back pain, unspecified: Secondary | ICD-10-CM | POA: Insufficient documentation

## 2016-01-12 MED ORDER — CYCLOBENZAPRINE HCL 5 MG PO TABS: 5.0000 mg | ORAL_TABLET | Freq: Two times a day (BID) | ORAL | 0 refills | Status: DC | PRN

## 2016-01-12 NOTE — Patient Instructions (Signed)
Thank you for choosing [facility].  SUMMARY AND INSTRUCTIONS:  Ice/moist heat 20 minutes every 2 hours as needed and after activity.  Ibuprofen/Aleve as needed.  Stretches and exercises daily.  If her symptoms worsen or do not improve please let us know and we will schedule for physical therapy.  Medication:  Cyclobenzaprine as needed for muscle spasm.   Your prescription(s) have been submitted to your pharmacy or been printed and provided for you. Please take as directed and contact our office if you believe you are having problem(s) with the medication(s) or have any questions.  Follow up:  If your symptoms worsen or fail to improve, please contact our office for further instruction, or in case of emergency go directly to the emergency room at the closest medical facility.    Low Back Strain Rehab Ask your health care provider which exercises are safe for you. Do exercises exactly as told by your health care provider and adjust them as directed. It is normal to feel mild stretching, pulling, tightness, or discomfort as you do these exercises, but you should stop right away if you feel sudden pain or your pain gets worse. Do not begin these exercises until told by your health care provider. Stretching and range of motion exercises These exercises warm up your muscles and joints and improve the movement and flexibility of your back. These exercises also help to relieve pain, numbness, and tingling. Exercise A: Single knee to chest 1. Lie on your back on a firm surface with both legs straight. 2. Bend one of your knees. Use your hands to move your knee up toward your chest until you feel a gentle stretch in your lower back and buttock.  Hold your leg in this position by holding onto the front of your knee.  Keep your other leg as straight as possible. 3. Hold for __________ seconds. 4. Slowly return to the starting position. 5. Repeat with your other leg. Repeat  __________ times. Complete this exercise __________ times a day. Exercise B: Prone extension on elbows 1. Lie on your abdomen on a firm surface. 2. Prop yourself up on your elbows. 3. Use your arms to help lift your chest up until you feel a gentle stretch in your abdomen and your lower back.  This will place some of your body weight on your elbows. If this is uncomfortable, try stacking pillows under your chest.  Your hips should stay down, against the surface that you are lying on. Keep your hip and back muscles relaxed. 4. Hold for __________ seconds. 5. Slowly relax your upper body and return to the starting position. Repeat __________ times. Complete this exercise __________ times a day. Strengthening exercises These exercises build strength and endurance in your back. Endurance is the ability to use your muscles for a long time, even after they get tired. Exercise C: Pelvic tilt 1. Lie on your back on a firm surface. Bend your knees and keep your feet flat. 2. Tense your abdominal muscles. Tip your pelvis up toward the ceiling and flatten your lower back into the floor.  To help with this exercise, you may place a small towel under your lower back and try to push your back into the towel. 3. Hold for __________ seconds. 4. Let your muscles relax completely before you repeat this exercise. Repeat __________ times. Complete this exercise __________ times a day. Exercise D: Alternating arm and leg raises 1. Get on your hands and knees on a firm surface. If  you are on a hard floor, you may want to use padding to cushion your knees, such as an exercise mat. 2. Line up your arms and legs. Your hands should be below your shoulders, and your knees should be below your hips. 3. Lift your left leg behind you. At the same time, raise your right arm and straighten it in front of you.  Do not lift your leg higher than your hip.  Do not lift your arm higher than your shoulder.  Keep your  abdominal and back muscles tight.  Keep your hips facing the ground.  Do not arch your back.  Keep your balance carefully, and do not hold your breath. 4. Hold for __________ seconds. 5. Slowly return to the starting position and repeat with your right leg and your left arm. Repeat __________ times. Complete this exercise __________times a day. Exercise J: Single leg lower with bent knees 1. Lie on your back on a firm surface. 2. Tense your abdominal muscles and lift your feet off the floor, one foot at a time, so your knees and hips are bent in an "L" shape (at about 90 degrees).  Your knees should be over your hips and your lower legs should be parallel to the floor. 3. Keeping your abdominal muscles tense and your knee bent, slowly lower one of your legs so your toe touches the ground. 4. Lift your leg back up to return to the starting position.  Do not hold your breath.  Do not let your back arch. Keep your back flat against the ground. 5. Repeat with your other leg. Repeat __________ times. Complete this exercise __________ times a day. Posture and body mechanics   Body mechanics refers to the movements and positions of your body while you do your daily activities. Posture is part of body mechanics. Good posture and healthy body mechanics can help to relieve stress in your body's tissues and joints. Good posture means that your spine is in its natural S-curve position (your spine is neutral), your shoulders are pulled back slightly, and your head is not tipped forward. The following are general guidelines for applying improved posture and body mechanics to your everyday activities. Standing   When standing, keep your spine neutral and your feet about hip-width apart. Keep a slight bend in your knees. Your ears, shoulders, and hips should line up.  When you do a task in which you stand in one place for a long time, place one foot up on a stable object that is 2-4 inches (5-10 cm)  high, such as a footstool. This helps keep your spine neutral. Sitting  When sitting, keep your spine neutral and keep your feet flat on the floor. Use a footrest, if necessary, and keep your thighs parallel to the floor. Avoid rounding your shoulders, and avoid tilting your head forward.  When working at a desk or a computer, keep your desk at a height where your hands are slightly lower than your elbows. Slide your chair under your desk so you are close enough to maintain good posture.  When working at a computer, place your monitor at a height where you are looking straight ahead and you do not have to tilt your head forward or downward to look at the screen. Resting   When lying down and resting, avoid positions that are most painful for you.  If you have pain with activities such as sitting, bending, stooping, or squatting (flexion-based activities), lie in a position in  which your body does not bend very much. For example, avoid curling up on your side with your arms and knees near your chest (fetal position).  If you have pain with activities such as standing for a long time or reaching with your arms (extension-based activities), lie with your spine in a neutral position and bend your knees slightly. Try the following positions:  Lying on your side with a pillow between your knees.  Lying on your back with a pillow under your knees. Lifting   When lifting objects, keep your feet at least shoulder-width apart and tighten your abdominal muscles.  Bend your knees and hips and keep your spine neutral. It is important to lift using the strength of your legs, not your back. Do not lock your knees straight out.  Always ask for help to lift heavy or awkward objects. This information is not intended to replace advice given to you by your health care provider. Make sure you discuss any questions you have with your health care provider. Document Released: 02/04/2005 Document Revised:  10/12/2015 Document Reviewed: 11/16/2014 Elsevier Interactive Patient Education  2017 [employer]

## 2016-01-12 NOTE — Assessment & Plan Note (Signed)
Chronic low back pain with recent exacerbation which appears to be improving. This is most likely due to tight musculature. Previous x-rays reviewed which showed mild degenerative disc disease which is unlikely the cause of his current symptoms although a contributing factor. Treat conservatively with ice/moist heat, home exercise therapy, and over-the-counter medications as needed for symptom relief and supportive care. Start cyclobenzaprine as needed for muscle spasms. If symptoms worsen or do not improve consider formal physical therapy.

## 2016-01-12 NOTE — Progress Notes (Signed)
Subjective:    Patient ID: Russell Shannon, male    DOB: [birth year], 65 y.o.   MRN: [id number]  Chief Complaint  Patient presents with  . Back Pain    Patient is having back pain and hip pain. Patient states that he was told that he has arthitis.     HPI:  Russell Shannon is a 65 y.o. male who  has a past medical history of Arthritis; Diabetes mellitus; Gout; Hyperlipidemia; and Hypertension. and presents today for an office follow up.  Associated symptom of pain located in his back and hip has been going on for about 6 months waxing and waning. The most recent episode started about 1 week ago. Denies any new trauma or injury. Pain started when he stood up. Pain is described as aching and sharp on occasions. Denies any sounds or sensations heard or felt. No stretches or exercises. Modifying factors include previously prescribed pain medications. Course of the symptoms has improved since onset. No changes to bowel patterns or saddle anesthesia. No radiculopathy. Activity is unrestricted.    No Known Allergies    Outpatient Medications Prior to Visit  Medication Sig Dispense Refill  . aspirin EC 81 MG tablet Take 81 mg by mouth daily.    . Calcium Polycarbophil (FIBER-CAPS PO) Take by mouth daily.    Marland Kitchen. losartan (COZAAR) 100 MG tablet Take 1 tablet (100 mg total) by mouth daily. 30 tablet 3  . metFORMIN (GLUCOPHAGE) 500 MG tablet 2 tab po bid 120 tablet 3  . omeprazole (PRILOSEC) 20 MG capsule Take 20 mg by mouth daily.    . simvastatin (ZOCOR) 20 MG tablet Take 1 tablet (20 mg total) by mouth at bedtime. 90 tablet 0  . colchicine 0.6 MG tablet Take 1 tablet (0.6 mg total) by mouth 2 (two) times daily. (Patient not taking: Reported on 01/12/2016) 60 tablet 1  . cyclobenzaprine (FLEXERIL) 5 MG tablet Take 1 tablet (5 mg total) by mouth at bedtime. (Patient not taking: Reported on 01/12/2016) 3 tablet 0   Facility-Administered Medications Prior to Visit  Medication Dose Route  Frequency Provider Last Rate Last Dose  . 0.9 %  sodium chloride infusion  500 mL Intravenous Continuous Hilarie FredricksonJohn N Perry, MD          Review of Systems  Constitutional: Negative for chills and fever.  Musculoskeletal: Positive for back pain.  Neurological: Negative for weakness and numbness.      Objective:    BP (!) 152/92 (BP Location: Left Arm, Patient Position: Sitting, Cuff Size: Normal)   Pulse 75   Temp 98.3 F (36.8 C) (Oral)   Ht 5\' 9"  (1.753 m)   Wt 214 lb 8 oz (97.3 kg)   SpO2 96%   BMI 31.68 kg/m  Nursing note and vital signs reviewed.  Physical Exam  Constitutional: He is oriented to person, place, and time. He appears well-developed and well-nourished. No distress.  Cardiovascular: Normal rate, regular rhythm, normal heart sounds and intact distal pulses.   Pulmonary/Chest: Effort normal and breath sounds normal.  Musculoskeletal:  Low back - no obvious deformity, discoloration, or edema. Palpable tenderness located over the posterior superior iliac spine and paraspinal musculature. Range of motion is within normal limits in all directions and mild discomfort noted in right lateral bending. Strength is normal in the bilateral lower extremities. Distal pulses, sensation, and reflexes are intact and appropriate. Negative straight leg raise. Negative Faber's test.  Neurological: He is alert and oriented to person, place, and  time.  Skin: Skin is warm and dry.  Psychiatric: He has a normal mood and affect. His behavior is normal. Judgment and thought content normal.       Assessment & Plan:   Problem List Items Addressed This Visit      Other   Low back pain    Chronic low back pain with recent exacerbation which appears to be improving. This is most likely due to tight musculature. Previous x-rays reviewed which showed mild degenerative disc disease which is unlikely the cause of his current symptoms although a contributing factor. Treat conservatively with  ice/moist heat, home exercise therapy, and over-the-counter medications as needed for symptom relief and supportive care. Start cyclobenzaprine as needed for muscle spasms. If symptoms worsen or do not improve consider formal physical therapy.      Relevant Medications   cyclobenzaprine (FLEXERIL) 5 MG tablet       I have changed Mr. Shelvin's cyclobenzaprine. I am also having him maintain his omeprazole, colchicine, metFORMIN, losartan, simvastatin, aspirin EC, and Calcium Polycarbophil (FIBER-CAPS PO). We will continue to administer sodium chloride.   Meds ordered this encounter  Medications  . cyclobenzaprine (FLEXERIL) 5 MG tablet    Sig: Take 1 tablet (5 mg total) by mouth 2 (two) times daily as needed for muscle spasms.    Dispense:  20 tablet    Refill:  0    For muscle spasm in lower back.    Order Specific Question:   Supervising Provider    Answer:   Hillard DankerRAWFORD, ELIZABETH A [[id number]]     Follow-up: Return if symptoms worsen or fail to improve.  Jeanine Luzalone, Amada Hallisey, FNP

## 2016-01-12 NOTE — Progress Notes (Signed)
Pre visit review using our clinic review tool, if applicable. No additional management support is needed unless otherwise documented below in the visit note. 

## 2016-02-02 ENCOUNTER — Ambulatory Visit (HOSPITAL_BASED_OUTPATIENT_CLINIC_OR_DEPARTMENT_OTHER)
Admission: RE | Admit: 2016-02-02 | Discharge: 2016-02-02 | Disposition: A | Discharge status: Home / Self Care | Payer: BLUE CROSS/BLUE SHIELD | Source: Ambulatory Visit | Attending: Medical | Admitting: Medical

## 2016-02-02 ENCOUNTER — Ambulatory Visit (INDEPENDENT_AMBULATORY_CARE_PROVIDER_SITE_OTHER): Payer: BLUE CROSS/BLUE SHIELD | Admitting: Medical

## 2016-02-02 ENCOUNTER — Encounter: Payer: Self-pay | Admitting: Medical

## 2016-02-02 ENCOUNTER — Other Ambulatory Visit: Payer: Self-pay | Admitting: Medical

## 2016-02-02 ENCOUNTER — Ambulatory Visit: Payer: BLUE CROSS/BLUE SHIELD | Admitting: Medical

## 2016-02-02 VITALS — BP 122/82 | HR 76 | Temp 98.0°F | Ht 69.0 in | Wt 211.4 lb

## 2016-02-02 DIAGNOSIS — I10 Essential (primary) hypertension: Secondary | ICD-10-CM | POA: Diagnosis not present

## 2016-02-02 DIAGNOSIS — E119 Type 2 diabetes mellitus without complications: Secondary | ICD-10-CM | POA: Diagnosis not present

## 2016-02-02 DIAGNOSIS — E785 Hyperlipidemia, unspecified: Secondary | ICD-10-CM | POA: Diagnosis not present

## 2016-02-02 DIAGNOSIS — M25551 Pain in right hip: Secondary | ICD-10-CM

## 2016-02-02 LAB — COMPREHENSIVE METABOLIC PANEL
ALT: 19 U/L (ref 0–53)
AST: 26 U/L (ref 0–37)
Albumin: 4.5 g/dL (ref 3.5–5.2)
Alkaline Phosphatase: 65 U/L (ref 39–117)
BUN: 13 mg/dL (ref 6–23)
CO2: 28 mEq/L (ref 19–32)
Calcium: 9.3 mg/dL (ref 8.4–10.5)
Chloride: 105 mEq/L (ref 96–112)
Creatinine, Ser: 0.93 mg/dL (ref 0.40–1.50)
GFR: 104.33 mL/min (ref >60.00)
Glucose, Bld: 109 mg/dL — ABNORMAL HIGH (ref 70–99)
Potassium: 4 mEq/L (ref 3.5–5.1)
Sodium: 140 mEq/L (ref 135–145)
Total Bilirubin: 0.4 mg/dL (ref 0.2–1.2)
Total Protein: 7.2 g/dL (ref 6.0–8.3)

## 2016-02-02 LAB — HEMOGLOBIN A1C: Hgb A1c MFr Bld: 6.9 % — ABNORMAL HIGH (ref 4.6–6.5)

## 2016-02-02 MED ORDER — COLCHICINE 0.6 MG PO TABS: 0.6000 mg | ORAL_TABLET | Freq: Two times a day (BID) | ORAL | 1 refills | Status: DC

## 2016-02-02 NOTE — Progress Notes (Signed)
Subjective:    Patient ID: Russell Shannon, male    DOB: Jan 01, 1951, 65 y.o.   MRN: [id number]  HPI  Pt in for follow up.   His bp is controlled today. No cardiac or neurologic signs or symptoms.  Pt last a1c was 6.7.No hyperglycemic signs or symptoms. Pt states when he checks his sugar is 120 on average after meals.  Fasting sugars 106 approximate.  Pt lipids mild to moderate elevated last time. Moderate diet compliance. He is on simvastatin.  Pt has not had any gout flares recently.  But patient does report occasional hip pain. Rt side hurts more.Hurts a lot at times after sitting and then standing up. Often not on standing.      Review of Systems  Constitutional: Negative for chills, fatigue and fever.  HENT: Negative for congestion, ear pain, postnasal drip, sinus pain and sinus pressure.   Respiratory: Negative for cough and shortness of breath.   Cardiovascular: Negative for chest pain and palpitations.  Gastrointestinal: Negative for abdominal pain.  Musculoskeletal: Negative for arthralgias, back pain and neck pain.       Rt hip pain.  Skin: Negative for rash.  Neurological: Negative for dizziness and light-headedness.  Hematological: Negative for adenopathy. Does not bruise/bleed easily.  Psychiatric/Behavioral: Negative for behavioral problems and confusion.    Past Medical History:  Diagnosis Date  . Arthritis    hips  . Diabetes mellitus   . Gout   . Hyperlipidemia   . Hypertension      Social History   Social History  . Marital status: Married    Spouse name: N/A  . Number of children: N/A  . Years of education: N/A   Occupational History  . Not on file.   Social History Main Topics  . Smoking status: Never Smoker  . Smokeless tobacco: Never Used  . Alcohol use No  . Drug use: No  . Sexual activity: Yes   Other Topics Concern  . Not on file   Social History Narrative  . No narrative on file    Past Surgical History:  Procedure  Laterality Date  . APPENDECTOMY  1971  . laceration hand Left 1975  . LUNG BIOPSY  1980    Family History  Problem Relation Age of Onset  . Hypertension Mother   . Hypertension Father   . Prostate cancer Brother   . Colon cancer Neg Hx   . Esophageal cancer Neg Hx   . Stomach cancer Neg Hx   . Rectal cancer Neg Hx   . Pancreatic cancer Neg Hx     No Known Allergies  Current Outpatient Prescriptions on File Prior to Visit  Medication Sig Dispense Refill  . aspirin EC 81 MG tablet Take 81 mg by mouth daily.    . Calcium Polycarbophil (FIBER-CAPS PO) Take by mouth daily.    Marland Kitchen. losartan (COZAAR) 100 MG tablet Take 1 tablet (100 mg total) by mouth daily. 30 tablet 3  . metFORMIN (GLUCOPHAGE) 500 MG tablet 2 tab po bid 120 tablet 3  . omeprazole (PRILOSEC) 20 MG capsule Take 20 mg by mouth daily.    . simvastatin (ZOCOR) 20 MG tablet Take 1 tablet (20 mg total) by mouth at bedtime. 90 tablet 0   Current Facility-Administered Medications on File Prior to Visit  Medication Dose Route Frequency Provider Last Rate Last Dose  . 0.9 %  sodium chloride infusion  500 mL Intravenous Continuous Hilarie FredricksonJohn N Perry, MD  BP 122/82 (BP Location: Left Arm, Patient Position: Sitting, Cuff Size: Normal)   Pulse 76   Temp 98 F (36.7 C) (Oral)   Ht 5\' 9"  (1.753 m)   Wt 211 lb 6.4 oz (95.9 kg)   SpO2 98%   BMI 31.22 kg/m       Objective:   Physical Exam  General Mental Status- Alert. General Appearance- Not in acute distress.   Skin General: Color- Normal Color. Moisture- Normal Moisture.  Neck Carotid Arteries- Normal color. Moisture- Normal Moisture. No carotid bruits. No JVD.  Chest and Lung Exam Auscultation: Breath Sounds:-Normal.  Cardiovascular Auscultation:Rythm- Regular. Murmurs & Other Heart Sounds:Auscultation of the heart reveals- No Murmurs.  Abdomen Inspection:-Inspeection Normal. Palpation/Percussion:Note:No mass. Palpation and Percussion of the abdomen  reveal- Non Tender, Non Distended + BS, no rebound or guarding.   Neurologic Cranial Nerve exam:- CN III-XII intact(No nystagmus), symmetric smile. Drift Test:- No drift. Romberg Exam:- Negative.  Heal to Toe Gait exam:-Normal. Finger to Nose:- Normal/Intact Strength:- 5/5 equal and symmetric strength both upper and lower extremities.  Lower ext/feet- see quality metrics.  Rt hip- no pain on palpation or rom.      Assessment & Plan:  Your bp is well controlled today. Continue your current bp meds.  Regarding your high cholesterol, you are not fasting today so won't check lipids today. Continue your zocor. Please remember in 3 months to fast day of appointment.  For diabetes will check your a1c. By your readings it appears a1c result may be on low side. Continue metformin.  Will get uric acid level today due to history of gout. Continue the colchicine if needed.  Will get xray of your rt hip today to assess joint space. Can use alleve if needed.   Zaven Klemens, Ramon DredgeEdward, PA-C

## 2016-02-02 NOTE — Patient Instructions (Addendum)
Your bp is well controlled today. Continue your current bp meds.  Regarding your high cholesterol, you are not fasting today so won't check lipids today. Continue your zocor. Please remember in 3 months to fast day of appointment.  For diabetes will check your a1c. By your readings it appears a1c result may be on low side. Continue metformin.  Will get uric acid level today due to history of gout. Continue the colchicine if needed.  Will get xray of your rt hip today to assess joint space. Can use alleve if needed.

## 2016-02-21 ENCOUNTER — Other Ambulatory Visit: Payer: Self-pay | Admitting: Medical

## 2016-03-29 ENCOUNTER — Telehealth: Payer: Self-pay | Admitting: Medical

## 2016-03-29 NOTE — Telephone Encounter (Signed)
Relation to WU:JWJXpt:self Call back number:[phone] Pharmacy: [id number] [place], [place] - [address] [facility] [address] [phone] (Phone) [phone] (Fax)     Reason for call:  Patient requesting a refill losartan (COZAAR) 100 MG tablet

## 2016-04-01 MED ORDER — LOSARTAN POTASSIUM 100 MG PO TABS: 100.0000 mg | ORAL_TABLET | Freq: Every day | ORAL | 3 refills | Status: DC

## 2016-04-01 NOTE — Telephone Encounter (Signed)
Refill [facility] per [facility]/SLS 02/12

## 2016-04-04 ENCOUNTER — Other Ambulatory Visit: Payer: Self-pay | Admitting: Medical

## 2016-05-03 ENCOUNTER — Ambulatory Visit: Payer: BLUE CROSS/BLUE SHIELD | Admitting: Medical

## 2016-05-10 ENCOUNTER — Ambulatory Visit: Payer: BLUE CROSS/BLUE SHIELD | Admitting: Medical

## 2016-05-20 ENCOUNTER — Encounter: Payer: Self-pay | Admitting: Medical

## 2016-05-20 ENCOUNTER — Telehealth: Payer: Self-pay | Admitting: Medical

## 2016-05-20 ENCOUNTER — Ambulatory Visit (INDEPENDENT_AMBULATORY_CARE_PROVIDER_SITE_OTHER): Payer: BLUE CROSS/BLUE SHIELD | Admitting: Medical

## 2016-05-20 VITALS — BP 140/90 | HR 74 | Temp 97.6°F | Resp 16 | Ht 69.0 in | Wt 213.2 lb

## 2016-05-20 DIAGNOSIS — I10 Essential (primary) hypertension: Secondary | ICD-10-CM

## 2016-05-20 DIAGNOSIS — E119 Type 2 diabetes mellitus without complications: Secondary | ICD-10-CM | POA: Diagnosis not present

## 2016-05-20 DIAGNOSIS — E785 Hyperlipidemia, unspecified: Secondary | ICD-10-CM

## 2016-05-20 DIAGNOSIS — M109 Gout, unspecified: Secondary | ICD-10-CM

## 2016-05-20 LAB — COMPREHENSIVE METABOLIC PANEL
ALT: 25 U/L (ref 0–53)
AST: 24 U/L (ref 0–37)
Albumin: 4.4 g/dL (ref 3.5–5.2)
Alkaline Phosphatase: 53 U/L (ref 39–117)
BUN: 22 mg/dL (ref 6–23)
CO2: 27 mEq/L (ref 19–32)
Calcium: 10 mg/dL (ref 8.4–10.5)
Chloride: 106 mEq/L (ref 96–112)
Creatinine, Ser: 1.27 mg/dL (ref 0.40–1.50)
GFR: 72.75 mL/min (ref >60.00)
Glucose, Bld: 134 mg/dL — ABNORMAL HIGH (ref 70–99)
Potassium: 4.3 mEq/L (ref 3.5–5.1)
Sodium: 141 mEq/L (ref 135–145)
Total Bilirubin: 0.3 mg/dL (ref 0.2–1.2)
Total Protein: 7.4 g/dL (ref 6.0–8.3)

## 2016-05-20 LAB — LIPID PANEL
Cholesterol: 238 mg/dL — ABNORMAL HIGH (ref 0–200)
HDL: 40.5 mg/dL (ref >39.00)
LDL Cholesterol: 178 mg/dL — ABNORMAL HIGH (ref 0–99)
NonHDL: 197.78
Total CHOL/HDL Ratio: 6
Triglycerides: 99 mg/dL (ref 0.0–149.0)
VLDL: 19.8 mg/dL (ref 0.0–40.0)

## 2016-05-20 LAB — URIC ACID: Uric Acid, Serum: 7.9 mg/dL — ABNORMAL HIGH (ref 4.0–7.8)

## 2016-05-20 MED ORDER — AMLODIPINE BESYLATE 5 MG PO TABS: 5.0000 mg | ORAL_TABLET | Freq: Every day | ORAL | 3 refills | Status: DC

## 2016-05-20 MED ORDER — SIMVASTATIN 40 MG PO TABS: 40.0000 mg | ORAL_TABLET | Freq: Every day | ORAL | 3 refills | Status: DC

## 2016-05-20 MED ORDER — COLCHICINE 0.6 MG PO TABS: 0.6000 mg | ORAL_TABLET | Freq: Two times a day (BID) | ORAL | 3 refills | Status: DC

## 2016-05-20 NOTE — Telephone Encounter (Signed)
Will you run pt a1c. I changed the order to present. Please run the a1c.

## 2016-05-20 NOTE — Progress Notes (Signed)
Pre visit review using our clinic review tool, if applicable. No additional management support is needed unless otherwise documented below in the visit note. 

## 2016-05-20 NOTE — Progress Notes (Signed)
Subjective:    Patient ID: Russell Shannon, male    DOB: [birth year], 66 y.o.   MRN: [id number]  HPI  Pt in for evaluation.  Pt blood pressure is borderline today when I checked.. Pt has no cardiac signs or symptoms.  Pt states bp is around 140 systolic most of the time. He works out of town. He does not have chance to walk where he lives. Pt is on losartan.   Pt cholesterol 6 months ago was high. Pt is fasting today. Pt is on zocor. He admits eating low cholesterol diet most of the time.  Pt a1-c in past was 6.9. He is on metformin. Pt states avoidings.  Hx of gout. No recent flares.    Review of Systems  Constitutional: Negative for chills, fatigue and fever.  Respiratory: Negative for cough, chest tightness, shortness of breath and wheezing.   Cardiovascular: Negative for chest pain and palpitations.  Gastrointestinal: Negative for abdominal pain, constipation and diarrhea.  Genitourinary: Negative for difficulty urinating, enuresis, flank pain, frequency and urgency.  Musculoskeletal: Negative for arthralgias, gait problem, joint swelling and neck stiffness.  Skin: Negative for rash.  Neurological: Negative for dizziness, weakness, numbness and headaches.  Hematological: Negative for adenopathy. Does not bruise/bleed easily.  Psychiatric/Behavioral: Negative for behavioral problems, confusion, hallucinations and suicidal ideas. The patient is not nervous/anxious.    Past Medical History:  Diagnosis Date  . Arthritis    hips  . Diabetes mellitus   . Gout   . Hyperlipidemia   . Hypertension      Social History   Social History  . Marital status: Married    Spouse name: N/A  . Number of children: N/A  . Years of education: N/A   Occupational History  . Not on file.   Social History Main Topics  . Smoking status: Never Smoker  . Smokeless tobacco: Never Used  . Alcohol use No  . Drug use: No  . Sexual activity: Yes   Other Topics Concern  . Not on file     Social History Narrative  . No narrative on file    Past Surgical History:  Procedure Laterality Date  . APPENDECTOMY  1971  . laceration hand Left 1975  . LUNG BIOPSY  1980    Family History  Problem Relation Age of Onset  . Hypertension Mother   . Hypertension Father   . Prostate cancer Brother   . Colon cancer Neg Hx   . Esophageal cancer Neg Hx   . Stomach cancer Neg Hx   . Rectal cancer Neg Hx   . Pancreatic cancer Neg Hx     No Known Allergies  Current Outpatient Prescriptions on File Prior to Visit  Medication Sig Dispense Refill  . aspirin EC 81 MG tablet Take 81 mg by mouth daily.    . Calcium Polycarbophil (FIBER-CAPS PO) Take by mouth daily.    . colchicine 0.6 MG tablet Take 1 tablet (0.6 mg total) by mouth 2 (two) times daily. 60 tablet 1  . losartan (COZAAR) 100 MG tablet Take 1 tablet (100 mg total) by mouth daily. 30 tablet 3  . metFORMIN (GLUCOPHAGE) 500 MG tablet TAKE 2 TABLETS BY MOUTH TWICE A DAY 120 tablet 3  . omeprazole (PRILOSEC) 20 MG capsule Take 20 mg by mouth daily.    . simvastatin (ZOCOR) 20 MG tablet TAKE 1 TABLET BY MOUTH AT BEDTIME 90 tablet 0   Current Facility-Administered Medications on File Prior to Visit  Medication  Dose Route Frequency Provider Last Rate Last Dose  . 0.9 %  sodium chloride infusion  500 mL Intravenous Continuous Hilarie Fredrickson, MD        BP 140/80 (BP Location: Right Arm, Cuff Size: Large)   Pulse 74   Temp 97.6 F (36.4 C) (Oral)   Resp 16   Ht  (1.753 m)   Wt 213 lb 3.2 oz (96.7 kg)   SpO2 98% Comment: room air  BMI 31.48 kg/m       Objective:   Physical Exam   General Mental Status- Alert. General Appearance- Not in acute distress.   Skin General: Color- Normal Color. Moisture- Normal Moisture.  Neck Carotid Arteries- Normal color. Moisture- Normal Moisture. No carotid bruits. No JVD.  Chest and Lung Exam Auscultation: Breath Sounds:-Normal.  Cardiovascular Auscultation:Rythm-  Regular. Murmurs & Other Heart Sounds:Auscultation of the heart reveals- No Murmurs.  Abdomen Inspection:-Inspeection Normal. Palpation/Percussion:Note:No mass. Palpation and Percussion of the abdomen reveal- Non Tender, Non Distended + BS, no rebound or guarding.    Neurologic Cranial Nerve exam:- CN III-XII intact(No nystagmus), symmetric smile. Strength:- 5/5 equal and symmetric strength both upper and lower extremities.  See feet exam-normal.    Assessment & Plan:  For your htn I do want to add medicine amlodipine. With your diabetes closer bp control is recommended. Would prefer you bp be closer to 130/80 range.  For your cholesterol and diabetes continue low cholestero/low sugar diet. Get more exericse. Check labs today.  For gout will get uric acid today.  Follow up 3 month or as needed  Khiley Lieser, Ramon Dredge, [employer]

## 2016-05-20 NOTE — Patient Instructions (Addendum)
For your htn I do want to add medicine amlodipine. With your diabetes closer bp control is recommended. Would prefer you bp be closer to 130/80 range.  For your cholesterol and diabetes continue low cholestero/low sugar diet. Get more exericse. Check labs today.  For gout will get uric acid today.  Follow up 3 month or as needed

## 2016-05-20 NOTE — Telephone Encounter (Signed)
simvastatin and colchicine sent to pt pharmacy.

## 2016-05-21 ENCOUNTER — Telehealth: Payer: Self-pay | Admitting: Medical

## 2016-05-21 LAB — HEMOGLOBIN A1C: Hgb A1c MFr Bld: 6.9 % — ABNORMAL HIGH (ref 4.6–6.5)

## 2016-05-21 NOTE — Telephone Encounter (Signed)
Caller name:Wacey Reid Relationship to patient: Can be [phone] Pharmacy:  Reason for call:Returning call

## 2016-05-21 NOTE — Telephone Encounter (Signed)
Order has been processed and blood will be picked up at 0930 with the first morning pick up...KMP

## 2016-06-04 ENCOUNTER — Other Ambulatory Visit: Payer: Self-pay | Admitting: Medical

## 2016-06-19 ENCOUNTER — Other Ambulatory Visit: Payer: Self-pay

## 2016-06-19 MED ORDER — LOSARTAN POTASSIUM 100 MG PO TABS: 100.0000 mg | ORAL_TABLET | Freq: Every day | ORAL | 0 refills | Status: DC

## 2016-06-19 MED ORDER — AMLODIPINE BESYLATE 5 MG PO TABS: 5.0000 mg | ORAL_TABLET | Freq: Every day | ORAL | 0 refills | Status: DC

## 2016-06-19 MED ORDER — METFORMIN HCL 500 MG PO TABS: 1000.0000 mg | ORAL_TABLET | Freq: Two times a day (BID) | ORAL | 0 refills | Status: DC

## 2016-08-02 ENCOUNTER — Telehealth: Payer: Self-pay | Admitting: Medical

## 2016-08-02 NOTE — Telephone Encounter (Signed)
Pt called stating that [facility] to get med orders from [facility] since May. He wants to get most meds from them moving forward. [facility] mail [facility] called immediately after to verify fax #.

## 2016-08-05 ENCOUNTER — Other Ambulatory Visit: Payer: Self-pay

## 2016-08-05 MED ORDER — AMLODIPINE BESYLATE 5 MG PO TABS: 5.0000 mg | ORAL_TABLET | Freq: Every day | ORAL | 0 refills | Status: DC

## 2016-08-05 MED ORDER — LOSARTAN POTASSIUM 100 MG PO TABS: 100.0000 mg | ORAL_TABLET | Freq: Every day | ORAL | 0 refills | Status: DC

## 2016-08-05 MED ORDER — METFORMIN HCL 500 MG PO TABS
1000.0000 mg | ORAL_TABLET | Freq: Two times a day (BID) | ORAL | 0 refills | Status: DC
Start: 2016-08-05 — End: 2016-08-13

## 2016-08-05 NOTE — Telephone Encounter (Signed)
[facility]

## 2016-08-05 NOTE — Telephone Encounter (Signed)
Left pt a message to call back. 

## 2016-08-06 ENCOUNTER — Encounter: Payer: Self-pay | Admitting: *Deleted

## 2016-08-06 LAB — HM DIABETES EYE EXAM

## 2016-08-07 ENCOUNTER — Other Ambulatory Visit: Payer: Self-pay | Admitting: Emergency Medicine

## 2016-08-07 ENCOUNTER — Other Ambulatory Visit: Payer: Self-pay

## 2016-08-07 MED ORDER — BLOOD GLUCOSE METER KIT: PACK | 0 refills | Status: DC

## 2016-08-13 ENCOUNTER — Other Ambulatory Visit: Payer: Self-pay | Admitting: *Deleted

## 2016-08-13 ENCOUNTER — Telehealth: Payer: Self-pay | Admitting: Medical

## 2016-08-13 MED ORDER — METFORMIN HCL 500 MG PO TABS: 1000.0000 mg | ORAL_TABLET | Freq: Two times a day (BID) | ORAL | 0 refills | Status: DC

## 2016-08-13 MED ORDER — BLOOD GLUCOSE METER KIT: PACK | 0 refills | Status: DC

## 2016-08-13 NOTE — Progress Notes (Signed)
Faxed refill [facility] for resend of 08/05/16 Rx refills escribed: Losartan, Amlodipine, Metformin [#180 sent in error, changed to #360 to cover 90-day supply], [phone] [facility] per [facility] protocol/SLS 06/26

## 2016-08-13 NOTE — Telephone Encounter (Signed)
Rx sent to [facility].  

## 2016-08-13 NOTE — Telephone Encounter (Signed)
Relation to pt:self °Call back number:[phone] °Pharmacy: °[facility] - [place], [place] - [address] [phone] (Phone) [phone] (Fax)  ° ° ° °Reason for call:  °Patient requesting blood glucose meter kit and supplies Rx please send to mail order ° °

## 2016-08-14 ENCOUNTER — Encounter: Payer: Self-pay | Admitting: Medical

## 2016-08-19 ENCOUNTER — Encounter: Payer: Self-pay | Admitting: Medical

## 2016-08-19 ENCOUNTER — Ambulatory Visit (INDEPENDENT_AMBULATORY_CARE_PROVIDER_SITE_OTHER): Payer: BLUE CROSS/BLUE SHIELD | Admitting: Medical

## 2016-08-19 VITALS — BP 138/78 | HR 66 | Temp 100.0°F | Resp 16 | Ht 69.0 in | Wt 209.6 lb

## 2016-08-19 DIAGNOSIS — E119 Type 2 diabetes mellitus without complications: Secondary | ICD-10-CM

## 2016-08-19 DIAGNOSIS — R972 Elevated prostate specific antigen [PSA]: Secondary | ICD-10-CM

## 2016-08-19 DIAGNOSIS — I10 Essential (primary) hypertension: Secondary | ICD-10-CM

## 2016-08-19 DIAGNOSIS — E785 Hyperlipidemia, unspecified: Secondary | ICD-10-CM | POA: Diagnosis not present

## 2016-08-19 DIAGNOSIS — M109 Gout, unspecified: Secondary | ICD-10-CM

## 2016-08-19 LAB — CBC WITH DIFFERENTIAL/PLATELET
Basophils Absolute: 0 10*3/uL (ref 0.0–0.1)
Basophils Relative: 0.9 % (ref 0.0–3.0)
Eosinophils Absolute: 0.1 10*3/uL (ref 0.0–0.7)
Eosinophils Relative: 2.2 % (ref 0.0–5.0)
HCT: 41.1 % (ref 39.0–52.0)
Hemoglobin: 13.5 g/dL (ref 13.0–17.0)
Lymphocytes Relative: 42.2 % (ref 12.0–46.0)
Lymphs Abs: 1.5 10*3/uL (ref 0.7–4.0)
MCHC: 32.8 g/dL (ref 30.0–36.0)
MCV: 85.8 fl (ref 78.0–100.0)
Monocytes Absolute: 0.3 10*3/uL (ref 0.1–1.0)
Monocytes Relative: 7.8 % (ref 3.0–12.0)
Neutro Abs: 1.7 10*3/uL (ref 1.4–7.7)
Neutrophils Relative %: 46.9 % (ref 43.0–77.0)
Platelets: 147 10*3/uL — ABNORMAL LOW (ref 150.0–400.0)
RBC: 4.79 Mil/uL (ref 4.22–5.81)
RDW: 14.9 % (ref 11.5–15.5)
WBC: 3.5 10*3/uL — ABNORMAL LOW (ref 4.0–10.5)

## 2016-08-19 LAB — COMPREHENSIVE METABOLIC PANEL
ALT: 19 U/L (ref 0–53)
AST: 17 U/L (ref 0–37)
Albumin: 4.3 g/dL (ref 3.5–5.2)
Alkaline Phosphatase: 50 U/L (ref 39–117)
BUN: 16 mg/dL (ref 6–23)
CO2: 26 mEq/L (ref 19–32)
Calcium: 9.2 mg/dL (ref 8.4–10.5)
Chloride: 106 mEq/L (ref 96–112)
Creatinine, Ser: 1.02 mg/dL (ref 0.40–1.50)
GFR: 93.63 mL/min (ref >60.00)
Glucose, Bld: 112 mg/dL — ABNORMAL HIGH (ref 70–99)
Potassium: 3.9 mEq/L (ref 3.5–5.1)
Sodium: 141 mEq/L (ref 135–145)
Total Bilirubin: 0.5 mg/dL (ref 0.2–1.2)
Total Protein: 7.1 g/dL (ref 6.0–8.3)

## 2016-08-19 LAB — URIC ACID: Uric Acid, Serum: 7.5 mg/dL (ref 4.0–7.8)

## 2016-08-19 LAB — LIPID PANEL
Cholesterol: 192 mg/dL (ref 0–200)
HDL: 39.2 mg/dL (ref >39.00)
LDL Cholesterol: 134 mg/dL — ABNORMAL HIGH (ref 0–99)
NonHDL: 152.64
Total CHOL/HDL Ratio: 5
Triglycerides: 92 mg/dL (ref 0.0–149.0)
VLDL: 18.4 mg/dL (ref 0.0–40.0)

## 2016-08-19 LAB — PSA: PSA: 8.87 ng/mL — ABNORMAL HIGH (ref 0.10–4.00)

## 2016-08-19 LAB — HEMOGLOBIN A1C: Hgb A1c MFr Bld: 6.7 % — ABNORMAL HIGH (ref 4.6–6.5)

## 2016-08-19 NOTE — Progress Notes (Addendum)
Subjective:    Patient ID: Russell Shannon, male    DOB: 30-Oct-1950, 66 y.o.   MRN: [id number]  HPI  Pr bp is good today. This am when he checked bp today 120/73. He checks and states 130/80 is bp most of the time.  Pt sugar this am was 89 this am. Last a1c less than 7. Pt checks his sugars and most of time less than 120. He can remember any levels less than 70.  Hx of gout and no gout flares.   Pt is fasitng today. Pt is on zocor and not real strict on diet.  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   Review of Systems  Constitutional: Negative for chills, fatigue and fever.  HENT: Negative for congestion and ear pain.   Respiratory: Negative for cough, chest tightness, shortness of breath and wheezing.   Cardiovascular: Negative for chest pain and palpitations.  Gastrointestinal: Negative for abdominal pain.  Genitourinary: Negative for decreased urine volume, dysuria, flank pain, frequency, genital sores, hematuria, penile swelling, testicular pain and urgency.  Musculoskeletal: Negative for back pain.  Skin: Negative for rash.  Neurological: Negative for dizziness, speech difficulty, light-headedness and headaches.  Hematological: Negative for adenopathy. Does not bruise/bleed easily.  Psychiatric/Behavioral: Negative for behavioral problems, decreased concentration and sleep disturbance. The patient is not nervous/anxious.    Past Medical History:  Diagnosis Date  . Arthritis    hips  . Diabetes mellitus   . Gout   . Hyperlipidemia   . Hypertension      Social History   Social History  . Marital status: Married    Spouse name: N/A  . Number of children: N/A  . Years of education: N/A   Occupational History  . Not on file.   Social History Main Topics  . Smoking status: Never Smoker  . Smokeless tobacco: Never Used  . Alcohol use No  . Drug use: No  . Sexual activity: Yes   Other Topics Concern  . Not on file   Social History Narrative    . No narrative on file    Past Surgical History:  Procedure Laterality Date  . APPENDECTOMY  1971  . laceration hand Left 1975  . LUNG BIOPSY  1980    Family History  Problem Relation Age of Onset  . Hypertension Mother   . Hypertension Father   . Prostate cancer Brother   . Colon cancer Neg Hx   . Esophageal cancer Neg Hx   . Stomach cancer Neg Hx   . Rectal cancer Neg Hx   . Pancreatic cancer Neg Hx     No Known Allergies  Current Outpatient Prescriptions on File Prior to Visit  Medication Sig Dispense Refill  . amLODipine (NORVASC) 5 MG tablet Take 1 tablet (5 mg total) by mouth daily. 90 tablet 0  . amoxicillin-clavulanate (AUGMENTIN) 875-125 MG tablet Take 1 tablet by mouth 2 (two) times daily.    Marland Kitchen aspirin EC 81 MG tablet Take 81 mg by mouth daily.    . blood glucose meter kit and supplies Check Blood Sugar 1-2 times a day. 1 each 0  . Calcium Polycarbophil (FIBER-CAPS PO) Take by mouth daily.    . colchicine 0.6 MG tablet Take 1 tablet (0.6 mg total) by mouth 2 (two) times daily. 60 tablet 3  . ibuprofen (ADVIL,MOTRIN) 800 MG tablet Take 800 mg by mouth 3 (three) times daily before meals.    Marland Kitchen losartan (COZAAR) 100 MG  tablet Take 1 tablet (100 mg total) by mouth daily. 90 tablet 0  . metFORMIN (GLUCOPHAGE) 500 MG tablet Take 2 tablets (1,000 mg total) by mouth 2 (two) times daily. 360 tablet 0  . omeprazole (PRILOSEC) 20 MG capsule Take 20 mg by mouth daily.    . simvastatin (ZOCOR) 20 MG tablet TAKE 1 TABLET BY MOUTH AT BEDTIME 90 tablet 0  . simvastatin (ZOCOR) 40 MG tablet Take 1 tablet (40 mg total) by mouth at bedtime. 30 tablet 3   Current Facility-Administered Medications on File Prior to Visit  Medication Dose Route Frequency Provider Last Rate Last Dose  . 0.9 %  sodium chloride infusion  500 mL Intravenous Continuous Irene Shipper, MD        BP 138/78 (BP Location: Right Arm, Patient Position: Sitting, Cuff Size: Normal)   Pulse 66   Temp 100 F (37.8  C) (Oral)   Resp 16   Ht '5\' 9"'  (1.753 m)   Wt 209 lb 9.6 oz (95.1 kg)   SpO2 100%   BMI 30.95 kg/m       Objective:   Physical Exam  General Mental Status- Alert. General Appearance- Not in acute distress.   Skin General: Color- Normal Color. Moisture- Normal Moisture.  Neck Carotid Arteries- Normal color. Moisture- Normal Moisture. No carotid bruits. No JVD.  Chest and Lung Exam Auscultation: Breath Sounds:-Normal.  Cardiovascular Auscultation:Rythm- Regular. Murmurs & Other Heart Sounds:Auscultation of the heart reveals- No Murmurs.  Abdomen Inspection:-Inspeection Normal. Palpation/Percussion:Note:No mass. Palpation and Percussion of the abdomen reveal- Non Tender, Non Distended + BS, no rebound or guarding.    Neurologic Cranial Nerve exam:- CN III-XII intact(No nystagmus), symmetric smile. Strength:- 5/5 equal and symmetric strength both upper and lower extremities.  Lower ext- see quality metrics  Rectal- hemoccult stool test negative. No nodules. Mild-moderate enlarge. Normal spincter tone. Smooth but slight hard feel. No nodules.      Assessment & Plan:  BP controlled today. Will check your a1c today to verify sugars are in control. Check cholesterol level today as well and uric acid level. In addition based on history checking psa as well. Continue current meds and may make adjustment after labs back.  Follow up in 3 months or as   Keyshia Orwick, Percell Miller, [employer]

## 2016-08-19 NOTE — Patient Instructions (Signed)
BP controlled today. Will check your a1c today to verify sugars are in control. Check cholesterol level today as well and uric acid level. In addition based on history checking psa as well. Continue current meds and may make adjustment after labs back.  Follow up in 3 months or as needed

## 2016-08-21 ENCOUNTER — Telehealth: Payer: Self-pay | Admitting: Medical

## 2016-08-21 DIAGNOSIS — R972 Elevated prostate specific antigen [PSA]: Secondary | ICD-10-CM

## 2016-08-21 NOTE — Telephone Encounter (Signed)
Referral to urologist placed. 

## 2016-09-24 ENCOUNTER — Other Ambulatory Visit: Payer: Self-pay | Admitting: Medical

## 2016-10-10 ENCOUNTER — Other Ambulatory Visit: Payer: Self-pay | Admitting: Medical

## 2016-10-11 MED ORDER — LOSARTAN POTASSIUM 100 MG PO TABS: 100.0000 mg | ORAL_TABLET | Freq: Every day | ORAL | 0 refills | Status: DC

## 2016-10-11 MED ORDER — SIMVASTATIN 40 MG PO TABS: 40.0000 mg | ORAL_TABLET | Freq: Every day | ORAL | 3 refills | Status: DC

## 2016-10-31 ENCOUNTER — Other Ambulatory Visit: Payer: Self-pay | Admitting: Medical

## 2016-11-01 ENCOUNTER — Other Ambulatory Visit: Payer: Self-pay | Admitting: Medical

## 2016-11-05 ENCOUNTER — Other Ambulatory Visit: Payer: Self-pay | Admitting: Medical

## 2016-11-22 ENCOUNTER — Encounter: Payer: Self-pay | Admitting: Medical

## 2016-11-22 ENCOUNTER — Ambulatory Visit (HOSPITAL_BASED_OUTPATIENT_CLINIC_OR_DEPARTMENT_OTHER)
Admission: RE | Admit: 2016-11-22 | Discharge: 2016-11-22 | Disposition: A | Discharge status: Home / Self Care | Payer: BLUE CROSS/BLUE SHIELD | Source: Ambulatory Visit | Attending: Medical | Admitting: Medical

## 2016-11-22 ENCOUNTER — Ambulatory Visit (INDEPENDENT_AMBULATORY_CARE_PROVIDER_SITE_OTHER): Payer: BLUE CROSS/BLUE SHIELD | Admitting: Medical

## 2016-11-22 VITALS — BP 140/90 | HR 57 | Resp 16 | Ht 69.0 in | Wt 214.4 lb

## 2016-11-22 DIAGNOSIS — M109 Gout, unspecified: Secondary | ICD-10-CM | POA: Diagnosis not present

## 2016-11-22 DIAGNOSIS — I10 Essential (primary) hypertension: Secondary | ICD-10-CM | POA: Diagnosis not present

## 2016-11-22 DIAGNOSIS — M25552 Pain in left hip: Secondary | ICD-10-CM | POA: Diagnosis not present

## 2016-11-22 DIAGNOSIS — Z23 Encounter for immunization: Secondary | ICD-10-CM

## 2016-11-22 DIAGNOSIS — E785 Hyperlipidemia, unspecified: Secondary | ICD-10-CM | POA: Diagnosis not present

## 2016-11-22 DIAGNOSIS — E119 Type 2 diabetes mellitus without complications: Secondary | ICD-10-CM | POA: Diagnosis not present

## 2016-11-22 LAB — COMPREHENSIVE METABOLIC PANEL
ALT: 27 U/L (ref 0–53)
AST: 28 U/L (ref 0–37)
Albumin: 4.2 g/dL (ref 3.5–5.2)
Alkaline Phosphatase: 52 U/L (ref 39–117)
BUN: 14 mg/dL (ref 6–23)
CO2: 29 mEq/L (ref 19–32)
Calcium: 9.5 mg/dL (ref 8.4–10.5)
Chloride: 102 mEq/L (ref 96–112)
Creatinine, Ser: 0.99 mg/dL (ref 0.40–1.50)
GFR: 96.83 mL/min (ref >60.00)
Glucose, Bld: 123 mg/dL — ABNORMAL HIGH (ref 70–99)
Potassium: 3.6 mEq/L (ref 3.5–5.1)
Sodium: 138 mEq/L (ref 135–145)
Total Bilirubin: 0.5 mg/dL (ref 0.2–1.2)
Total Protein: 7 g/dL (ref 6.0–8.3)

## 2016-11-22 LAB — LIPID PANEL
Cholesterol: 213 mg/dL — ABNORMAL HIGH (ref 0–200)
HDL: 43.1 mg/dL (ref >39.00)
LDL Cholesterol: 149 mg/dL — ABNORMAL HIGH (ref 0–99)
NonHDL: 170.25
Total CHOL/HDL Ratio: 5
Triglycerides: 105 mg/dL (ref 0.0–149.0)
VLDL: 21 mg/dL (ref 0.0–40.0)

## 2016-11-22 LAB — URIC ACID: Uric Acid, Serum: 7.5 mg/dL (ref 4.0–7.8)

## 2016-11-22 LAB — HEMOGLOBIN A1C: Hgb A1c MFr Bld: 6.8 % — ABNORMAL HIGH (ref 4.6–6.5)

## 2016-11-22 MED ORDER — AMLODIPINE BESYLATE 10 MG PO TABS: 10.0000 mg | ORAL_TABLET | Freq: Every day | ORAL | 1 refills | Status: DC

## 2016-11-22 NOTE — Patient Instructions (Addendum)
For your high blood pressure I will increase your amlodipine to 10 mg a day and continue the losartan at the same dose. Your blood pressure is moderately high today and I would like to be closer to 130/80.  For high cholesterol will check fasting lipid panel and metabolic panel.  For diabetes will get A1c today and see if you need adjustment of current medication.  For history of gout will get uric acid level and see if less hip pain might be mild gout flare. Also will get left hip x-ray today. Remind her that he can take the  Colchicine twice daily as preventative treatment for gout. I did write a prescription to take this way.  Follow-up date to be determined after lab review. Probably will be in 3 months or as needed.  Flu vaccine given today.

## 2016-11-22 NOTE — Progress Notes (Signed)
Subjective:    Patient ID: Russell Shannon, male    DOB: 06-17-1950, 66 y.o.   MRN: [id number]  HPI  Pt in for check up/evaluation. His bp when he checks 128/76. Most of time controlled per pt. Occasional 140/80. Pt has his own bp cuff. No cardiac or neurologic signs or symptoms.   Pt last a1-c 3 months ago was 6.7. No hyper or hypoglycemic signs or symptoms. Pt on metformin. He can only tolerate 1 bid due to loose stool side effect at higher dose.  Pt has high cholesterol on zocor. He is fasting.  Pt has gout and taking colchicine only when he has perceived flare but not taking it daily.  Pt has some left  hip pain recently at night. He states for a while but does not specify. No injury or fall. He speculates maybe from standing all day at work.  Pt will get flu vaccine today.   Review of Systems  Constitutional: Negative for chills, fatigue and fever.  Respiratory: Negative for cough, choking, shortness of breath and wheezing.   Cardiovascular: Negative for chest pain and palpitations.  Gastrointestinal: Negative for abdominal pain.  Endocrine: Negative for polydipsia, polyphagia and polyuria.  Musculoskeletal:       Left hip pain.  Neurological: Negative for dizziness, weakness, numbness and headaches.  Hematological: Negative for adenopathy. Does not bruise/bleed easily.  Psychiatric/Behavioral: Negative for behavioral problems and confusion.    Past Medical History:  Diagnosis Date  . Arthritis    hips  . Diabetes mellitus   . Gout   . Hyperlipidemia   . Hypertension      Social History   Social History  . Marital status: Married    Spouse name: N/A  . Number of children: N/A  . Years of education: N/A   Occupational History  . Not on file.   Social History Main Topics  . Smoking status: Never Smoker  . Smokeless tobacco: Never Used  . Alcohol use No  . Drug use: No  . Sexual activity: Yes   Other Topics Concern  . Not on file   Social History  Narrative  . No narrative on file    Past Surgical History:  Procedure Laterality Date  . APPENDECTOMY  1971  . laceration hand Left 1975  . LUNG BIOPSY  1980    Family History  Problem Relation Age of Onset  . Hypertension Mother   . Hypertension Father   . Prostate cancer Brother   . Colon cancer Neg Hx   . Esophageal cancer Neg Hx   . Stomach cancer Neg Hx   . Rectal cancer Neg Hx   . Pancreatic cancer Neg Hx     No Known Allergies  Current Outpatient Prescriptions on File Prior to Visit  Medication Sig Dispense Refill  . aspirin EC 81 MG tablet Take 81 mg by mouth daily.    . blood glucose meter kit and supplies Check Blood Sugar 1-2 times a day. 1 each 0  . Calcium Polycarbophil (FIBER-CAPS PO) Take by mouth daily.    . colchicine 0.6 MG tablet Take 1 tablet (0.6 mg total) by mouth 2 (two) times daily. 60 tablet 3  . ibuprofen (ADVIL,MOTRIN) 800 MG tablet Take 800 mg by mouth 3 (three) times daily before meals.    Marland Kitchen losartan (COZAAR) 100 MG tablet Take 1 tablet (100 mg total) by mouth daily. 90 tablet 0  . metFORMIN (GLUCOPHAGE) 500 MG tablet TAKE 2 TABLETS TWICE DAILY 360  tablet 0  . omeprazole (PRILOSEC) 20 MG capsule Take 20 mg by mouth daily.    . simvastatin (ZOCOR) 20 MG tablet TAKE 1 TABLET BY MOUTH AT BEDTIME 90 tablet 0  . simvastatin (ZOCOR) 40 MG tablet Take 1 tablet (40 mg total) by mouth at bedtime. 30 tablet 3  . TRUE METRIX BLOOD GLUCOSE TEST test strip CHECK BLOOD SUGAR ONE TO TWO TIMES DAILY 100 each 2  . TRUEPLUS LANCETS 33G MISC CHECK BLOOD SUGAR  1  TO  2  TIMES  A  DAY 100 each 0   Current Facility-Administered Medications on File Prior to Visit  Medication Dose Route Frequency Provider Last Rate Last Dose  . 0.9 %  sodium chloride infusion  500 mL Intravenous Continuous Irene Shipper, MD        BP (!) 158/87   Pulse (!) 57   Resp 16   Ht '5\' 9"'  (1.753 m)   Wt 214 lb 6.4 oz (97.3 kg)   SpO2 99%   BMI 31.66 kg/m       Objective:    Physical Exam  General Mental Status- Alert. General Appearance- Not in acute distress.   Skin General: Color- Normal Color. Moisture- Normal Moisture.  Neck Carotid Arteries- Normal color. Moisture- Normal Moisture. No carotid bruits. No JVD.  Chest and Lung Exam Auscultation: Breath Sounds:-Normal.  Cardiovascular Auscultation:Rythm- Regular. Murmurs & Other Heart Sounds:Auscultation of the heart reveals- No Murmurs.  Abdomen Inspection:-Inspeection Normal. Palpation/Percussion:Note:No mass. Palpation and Percussion of the abdomen reveal- Non Tender, Non Distended + BS, no rebound or guarding.    Neurologic Cranial Nerve exam:- CN III-XII intact(No nystagmus), symmetric smile. Normal/IntactStrength:- 5/5 equal and symmetric strength both upper and lower extremities.  Left hip- mild pain on range of motion.         Assessment & Plan:  For your high blood pressure I will increase your amlodipine to 10 mg a day and continue the losartan at the same dose. Your blood pressure is moderately high today and I would like to be closer to 130/80.  For high cholesterol will check fasting lipid panel and metabolic panel.  For diabetes will get A1c today and see if you need adjustment of current medication.  For history of gout will get uric acid level and see if less hip pain might be mild gout flare. Also will get left hip x-ray today. Remind her that he can take the  Colchicine twice daily as preventative treatment for gout. I did write a prescription to take this way.  Follow-up date to be determined after lab review. Probably will be in 3 months or as needed.  Flu vaccine given today.  Hyder Deman, Percell Miller, PA-C

## 2016-11-29 ENCOUNTER — Telehealth: Payer: Self-pay | Admitting: Medical

## 2016-11-29 NOTE — Telephone Encounter (Signed)
Caller name: Brysun  Relation to pt: self  Call back number: [phone] Pharmacy:  Reason for call: Pt called wanting to know his lab results done on 11-22-2016. Please call pt back after 3:00 since pt at work. Please advise.

## 2016-12-02 NOTE — Telephone Encounter (Signed)
Mailed letter °

## 2016-12-17 ENCOUNTER — Other Ambulatory Visit: Payer: Self-pay | Admitting: Medical

## 2016-12-25 ENCOUNTER — Other Ambulatory Visit: Payer: Self-pay

## 2017-01-06 ENCOUNTER — Other Ambulatory Visit: Payer: Self-pay | Admitting: Medical

## 2017-02-18 ENCOUNTER — Other Ambulatory Visit: Payer: Self-pay | Admitting: Medical

## 2017-03-11 ENCOUNTER — Other Ambulatory Visit: Payer: Self-pay | Admitting: Medical

## 2017-03-17 ENCOUNTER — Other Ambulatory Visit: Payer: Self-pay | Admitting: Medical

## 2017-04-14 ENCOUNTER — Telehealth: Payer: Self-pay | Admitting: Medical

## 2017-04-15 NOTE — Telephone Encounter (Signed)
Pt is due for follow up please call and schedule appointment.  

## 2017-04-21 NOTE — Telephone Encounter (Signed)
If he needs refill of bp medication can go ahead and give. But only for 30 days. He needs to come for a follow up. Notify pt and schedule him

## 2017-04-21 NOTE — Telephone Encounter (Signed)
Call and left message for patient to call back and make a appointment with Russell Shannon.

## 2017-04-25 ENCOUNTER — Encounter: Payer: Self-pay | Admitting: Medical

## 2017-04-25 ENCOUNTER — Ambulatory Visit (INDEPENDENT_AMBULATORY_CARE_PROVIDER_SITE_OTHER): Payer: BLUE CROSS/BLUE SHIELD | Admitting: Medical

## 2017-04-25 VITALS — BP 118/67 | HR 66 | Temp 97.4°F | Resp 16 | Ht 69.0 in | Wt 205.0 lb

## 2017-04-25 DIAGNOSIS — M5432 Sciatica, left side: Secondary | ICD-10-CM

## 2017-04-25 DIAGNOSIS — K649 Unspecified hemorrhoids: Secondary | ICD-10-CM | POA: Diagnosis not present

## 2017-04-25 DIAGNOSIS — I10 Essential (primary) hypertension: Secondary | ICD-10-CM | POA: Diagnosis not present

## 2017-04-25 DIAGNOSIS — E785 Hyperlipidemia, unspecified: Secondary | ICD-10-CM

## 2017-04-25 DIAGNOSIS — E119 Type 2 diabetes mellitus without complications: Secondary | ICD-10-CM

## 2017-04-25 DIAGNOSIS — M109 Gout, unspecified: Secondary | ICD-10-CM | POA: Diagnosis not present

## 2017-04-25 LAB — COMPREHENSIVE METABOLIC PANEL
ALT: 24 U/L (ref 0–53)
AST: 25 U/L (ref 0–37)
Albumin: 4.3 g/dL (ref 3.5–5.2)
Alkaline Phosphatase: 54 U/L (ref 39–117)
BUN: 13 mg/dL (ref 6–23)
CO2: 28 mEq/L (ref 19–32)
Calcium: 9.5 mg/dL (ref 8.4–10.5)
Chloride: 104 mEq/L (ref 96–112)
Creatinine, Ser: 0.91 mg/dL (ref 0.40–1.50)
GFR: 106.59 mL/min (ref >60.00)
Glucose, Bld: 106 mg/dL — ABNORMAL HIGH (ref 70–99)
Potassium: 3.9 mEq/L (ref 3.5–5.1)
Sodium: 138 mEq/L (ref 135–145)
Total Bilirubin: 0.5 mg/dL (ref 0.2–1.2)
Total Protein: 7.2 g/dL (ref 6.0–8.3)

## 2017-04-25 LAB — HEMOGLOBIN A1C: Hgb A1c MFr Bld: 6.6 % — ABNORMAL HIGH (ref 4.6–6.5)

## 2017-04-25 LAB — LIPID PANEL
Cholesterol: 111 mg/dL (ref 0–200)
HDL: 41.2 mg/dL (ref >39.00)
LDL Cholesterol: 57 mg/dL (ref 0–99)
NonHDL: 70.11
Total CHOL/HDL Ratio: 3
Triglycerides: 68 mg/dL (ref 0.0–149.0)
VLDL: 13.6 mg/dL (ref 0.0–40.0)

## 2017-04-25 LAB — URIC ACID: Uric Acid, Serum: 7.1 mg/dL (ref 4.0–7.8)

## 2017-04-25 MED ORDER — ALLOPURINOL 100 MG PO TABS: 100.0000 mg | ORAL_TABLET | Freq: Every day | ORAL | 6 refills | Status: DC

## 2017-04-25 MED ORDER — DICLOFENAC SODIUM 75 MG PO TBEC: 75.0000 mg | DELAYED_RELEASE_TABLET | Freq: Two times a day (BID) | ORAL | 0 refills | Status: DC

## 2017-04-25 MED ORDER — HYDROCORTISONE ACETATE 25 MG RE SUPP: 25.0000 mg | Freq: Two times a day (BID) | RECTAL | 0 refills | Status: DC

## 2017-04-25 MED ORDER — TRAMADOL HCL 50 MG PO TABS: 50.0000 mg | ORAL_TABLET | Freq: Three times a day (TID) | ORAL | 0 refills | Status: DC | PRN

## 2017-04-25 NOTE — Progress Notes (Signed)
Subjective:    Patient ID: Russell Shannon, male    DOB: 1950/05/02, 67 y.o.   MRN: [id number]  HPI  Pt in for check up/evaluation. His bp when he checks 120/80 apprxomate when he checks. Pt has his own bp cuff. No cardiac or neurologic signs or symptoms.   Pt last a1-c 3 months ago was 6.8. No hyper or hypoglycemic signs or symptoms. Pt on metformin. He can only tolerate 1 bid due to loose stool side effect at higher dose.  Pt has high cholesterol on zocor. He is fasting.  Pt has gout and taking colchicine only when he has perceived flare but not taking it daily. His insurance has questions on why colchicine needed?? Pt has gout. Recently no acute flare in about 6 months per pt.   Pt has some left SI  recently every day for about 1-2 months. Pain radiates all the way down his back of his leg, left calf and to foot. Pain states pain may last up to 12 hours. At times pain in sciatic area can be severe.  Also states he had recent hemorrhoid flares. He feels the hemorrhoid and has some itching. He states he feels them pop out occasional using bathroom but denies chronic constipation.  Some purposeful weight loss with diet since last visit.     Review of Systems  Constitutional: Negative for chills, fatigue and fever.  HENT: Negative for congestion, ear discharge, nosebleeds, sinus pressure, sinus pain and sneezing.   Respiratory: Negative for cough, chest tightness, shortness of breath and wheezing.   Cardiovascular: Negative for chest pain and palpitations.  Gastrointestinal: Negative for abdominal pain, diarrhea, nausea and vomiting.       See hpi  Genitourinary: Negative for difficulty urinating, enuresis, flank pain and frequency.  Musculoskeletal: Negative for back pain, neck pain and neck stiffness.       See hpi.  Skin: Negative for rash.  Neurological: Negative for dizziness, speech difficulty, weakness and headaches.  Hematological: Negative for adenopathy. Does not  bruise/bleed easily.  Psychiatric/Behavioral: Negative for behavioral problems and confusion.   Past Medical History:  Diagnosis Date  . Arthritis    hips  . Diabetes mellitus   . Gout   . Hyperlipidemia   . Hypertension      Social History   Socioeconomic History  . Marital status: Married    Spouse name: Not on file  . Number of children: Not on file  . Years of education: Not on file  . Highest education level: Not on file  Social Needs  . Financial resource strain: Not on file  . Food insecurity - worry: Not on file  . Food insecurity - inability: Not on file  . Transportation needs - medical: Not on file  . Transportation needs - non-medical: Not on file  Occupational History  . Not on file  Tobacco Use  . Smoking status: Never Smoker  . Smokeless tobacco: Never Used  Substance and Sexual Activity  . Alcohol use: No  . Drug use: No  . Sexual activity: Yes  Other Topics Concern  . Not on file  Social History Narrative  . Not on file    Past Surgical History:  Procedure Laterality Date  . APPENDECTOMY  1971  . laceration hand Left 1975  . LUNG BIOPSY  1980    Family History  Problem Relation Age of Onset  . Hypertension Mother   . Hypertension Father   . Prostate cancer Brother   .  Colon cancer Neg Hx   . Esophageal cancer Neg Hx   . Stomach cancer Neg Hx   . Rectal cancer Neg Hx   . Pancreatic cancer Neg Hx     No Known Allergies  Current Outpatient Medications on File Prior to Visit  Medication Sig Dispense Refill  . amLODipine (NORVASC) 10 MG tablet TAKE 1 TABLET (10 MG TOTAL) BY MOUTH DAILY. 90 tablet 1  . aspirin EC 81 MG tablet Take 81 mg by mouth daily.    . blood glucose meter kit and supplies Check Blood Sugar 1-2 times a day. 1 each 0  . Calcium Polycarbophil (FIBER-CAPS PO) Take by mouth daily.    Marland Kitchen ibuprofen (ADVIL,MOTRIN) 800 MG tablet Take 800 mg by mouth 3 (three) times daily before meals.    Marland Kitchen losartan (COZAAR) 100 MG tablet  Take 1 tablet (100 mg total) by mouth daily. 90 tablet 0  . metFORMIN (GLUCOPHAGE) 500 MG tablet TAKE 2 TABLETS TWICE DAILY 360 tablet 0  . omeprazole (PRILOSEC) 20 MG capsule Take 20 mg by mouth daily.    . simvastatin (ZOCOR) 40 MG tablet TAKE 1 TABLET AT BEDTIME 90 tablet 3  . TRUE METRIX BLOOD GLUCOSE TEST test strip CHECK BLOOD SUGAR  ONCE  TO TWICE DAILY 100 each 2  . TRUEPLUS LANCETS 33G MISC CHECK BLOOD SUGAR  1  TO  2  TIMES  A  DAY 100 each 0   Current Facility-Administered Medications on File Prior to Visit  Medication Dose Route Frequency Provider Last Rate Last Dose  . 0.9 %  sodium chloride infusion  500 mL Intravenous Continuous Irene Shipper, MD        BP 118/67   Pulse 66   Temp (!) 97.4 F (36.3 C) (Oral)   Resp 16   Ht _0  (1.753 m)   Wt 205 lb (93 kg)   SpO2 99%   BMI 30.27 kg/m       Objective:   Physical Exam  General Mental Status- Alert. General Appearance- Not in acute distress.   Skin General: Color- Normal Color. Moisture- Normal Moisture.  Neck Carotid Arteries- Normal color. Moisture- Normal Moisture. No carotid bruits. No JVD.  Chest and Lung Exam Auscultation: Breath Sounds:-Normal.  Cardiovascular Auscultation:Rythm- Regular. Murmurs & Other Heart Sounds:Auscultation of the heart reveals- No Murmurs.  Abdomen Inspection:-Inspeection Normal. Palpation/Percussion:Note:No mass. Palpation and Percussion of the abdomen reveal- Non Tender, Non Distended + BS, no rebound or guarding.    Neurologic Cranial Nerve exam:- CN III-XII intact(No nystagmus), symmetric smile. Strength:- 5/5 equal and symmetric strength both upper and lower extremities.  Rectal- on inspection. 2 small nonthrombosed hemorrhoids in approximate 7 oclock position.  Back- no mid lumbar tenderness but some left SI tenderness to palpation.      Assessment & Plan:  Your blood pressure is well controlled today and also controlled when you check BP level.  Continue  current BP medication regimen.  For your diabetes and high cholesterol, I am ordering labs to include CMP, lipid panel and A1c.  In addition for your gout history getting the uric acid level.  Continue your diabetic medication and cholesterol meds.  For your gout I am prescribing allopurinol to prevent future flares.  Also recommend trying to go ahead and get colchicine filled for future flares.  For your sciatica pain recently, I am prescribing diclofenac.  If you have episodes of severe pain could use tramadol.  However use sparingly as I explained.  In  the event you have severe persisting sciatica type pain then might consider low-dose tapered prednisone.  For hemorrhoids I prescribed Anusol HC.  Try to keep self well-hydrated and avoid constipation.  Follow-up in 3 months or as needed.  Mackie Pai, PA-C

## 2017-04-25 NOTE — Patient Instructions (Signed)
Your blood pressure is well controlled today and also controlled when you check BP level.  Continue current BP medication regimen.  For your diabetes and high cholesterol, I am ordering labs to include CMP, lipid panel and A1c.  In addition for your gout history getting the uric acid level.  Continue your diabetic medication and cholesterol meds.  For your gout I am prescribing allopurinol to prevent future flares.  Also recommend trying to go ahead and get colchicine filled for future flares.  For your sciatica pain recently, I am prescribing diclofenac.  If you have episodes of severe pain could use tramadol.  However use sparingly as I explained.  In the event you have severe persisting sciatica type pain then might consider low-dose tapered prednisone.  For hemorrhoids I prescribed Anusol HC.  Try to keep self well-hydrated and avoid constipation.  Follow-up in 3 months or as needed.

## 2017-04-26 ENCOUNTER — Telehealth: Payer: Self-pay | Admitting: Medical

## 2017-04-26 NOTE — Telephone Encounter (Signed)
Opened to review med list. 

## 2017-04-28 NOTE — Telephone Encounter (Signed)
Had appointment 04-25-17

## 2017-05-21 ENCOUNTER — Other Ambulatory Visit: Payer: Self-pay | Admitting: Medical

## 2017-05-21 NOTE — Telephone Encounter (Signed)
Voltaren and Tramadol refills Last OV: 04/25/17 PCP: [place] [facility] [address] [place] [address] [phone] (Phone) [phone] (Fax)

## 2017-05-21 NOTE — Telephone Encounter (Signed)
Copied from CRM [id number]. Topic: Quick Communication - Rx Refill/Question >> May 21, 2017  4:31 PM Rudi CocoLathan, Samona Chihuahua M, [place] wrote: Medication: diclofenac (VOLTAREN) 75 MG EC tablet [[id number]][[id number]]  traMADol (ULTRAM) 50 MG tablet [[id number]][[id number]]  Has the patient contacted their pharmacy? yes (Agent: If no, request that the patient contact the pharmacy for the refill.) Preferred Pharmacy (with phone number or street name): [facility] [address] - [place], [place] [id number] [place] [id number] Phone: [phone] Fax: [phone]   Agent: Please be advised that RX refills may take up to 3 business days. We ask that you follow-up with your pharmacy.

## 2017-05-22 ENCOUNTER — Other Ambulatory Visit: Payer: Self-pay | Admitting: Medical

## 2017-05-22 MED ORDER — TRAMADOL HCL 50 MG PO TABS: 50.0000 mg | ORAL_TABLET | Freq: Three times a day (TID) | ORAL | 0 refills | Status: DC | PRN

## 2017-05-22 MED ORDER — DICLOFENAC SODIUM 75 MG PO TBEC: 75.0000 mg | DELAYED_RELEASE_TABLET | Freq: Two times a day (BID) | ORAL | 0 refills | Status: DC

## 2017-05-22 NOTE — Telephone Encounter (Signed)
Sent in rx of tramadol and diclofenac. If pain persists then have pt make appointment. Verify where pain is. Last gave this for sciatica type pain.  Low number of tramadol given. He is not on contract.

## 2017-05-22 NOTE — Telephone Encounter (Signed)
Pt requesting refill for diclofenac and Tramadol. Please advise.

## 2017-06-05 ENCOUNTER — Ambulatory Visit (INDEPENDENT_AMBULATORY_CARE_PROVIDER_SITE_OTHER): Payer: BLUE CROSS/BLUE SHIELD | Admitting: Medical

## 2017-06-05 ENCOUNTER — Encounter: Payer: Self-pay | Admitting: Medical

## 2017-06-05 VITALS — BP 130/81 | HR 75 | Temp 98.1°F | Resp 16 | Ht 69.0 in | Wt 206.8 lb

## 2017-06-05 DIAGNOSIS — M5432 Sciatica, left side: Secondary | ICD-10-CM

## 2017-06-05 MED ORDER — CYCLOBENZAPRINE HCL 5 MG PO TABS: 5.0000 mg | ORAL_TABLET | Freq: Three times a day (TID) | ORAL | 1 refills | Status: DC | PRN

## 2017-06-05 MED ORDER — MELOXICAM 7.5 MG PO TABS: ORAL_TABLET | ORAL | 0 refills | Status: DC

## 2017-06-05 MED ORDER — TRAMADOL HCL 50 MG PO TABS: 50.0000 mg | ORAL_TABLET | Freq: Three times a day (TID) | ORAL | 0 refills | Status: DC | PRN

## 2017-06-05 NOTE — Patient Instructions (Addendum)
You do appear to have sciatica type pain.   Advise DC diclofenac.  Start meloxicam 7.5 mg 1-2 tab daily. Start flexeril tonight. Only use at night.  Rx tramadol for more severe pain. Can get salon pas lidocaine patch otc and apply to area.  Back exercises as tolerated.  Follow up in 2 weeks or as needed If pain persists or become mid spinal then consider lumbar xray.   Back Exercises If you have pain in your back, do these exercises 2-3 times each day or as told by your doctor. When the pain goes away, do the exercises once each day, but repeat the steps more times for each exercise (do more repetitions). If you do not have pain in your back, do these exercises once each day or as told by your doctor. Exercises Single Knee to Chest  Do these steps 3-5 times in a row for each leg: 1. Lie on your back on a firm bed or the floor with your legs stretched out. 2. Bring one knee to your chest. 3. Hold your knee to your chest by grabbing your knee or thigh. 4. Pull on your knee until you feel a gentle stretch in your lower back. 5. Keep doing the stretch for 10-30 seconds. 6. Slowly let go of your leg and straighten it.  Pelvic Tilt  Do these steps 5-10 times in a row: 1. Lie on your back on a firm bed or the floor with your legs stretched out. 2. Bend your knees so they point up to the ceiling. Your feet should be flat on the floor. 3. Tighten your lower belly (abdomen) muscles to press your lower back against the floor. This will make your tailbone point up to the ceiling instead of pointing down to your feet or the floor. 4. Stay in this position for 5-10 seconds while you gently tighten your muscles and breathe evenly.  Cat-Cow  Do these steps until your lower back bends more easily: 1. Get on your hands and knees on a firm surface. Keep your hands under your shoulders, and keep your knees under your hips. You may put padding under your knees. 2. Let your head hang down, and  make your tailbone point down to the floor so your lower back is round like the back of a cat. 3. Stay in this position for 5 seconds. 4. Slowly lift your head and make your tailbone point up to the ceiling so your back hangs low (sags) like the back of a cow. 5. Stay in this position for 5 seconds.  Press-Ups  Do these steps 5-10 times in a row: 1. Lie on your belly (face-down) on the floor. 2. Place your hands near your head, about shoulder-width apart. 3. While you keep your back relaxed and keep your hips on the floor, slowly straighten your arms to raise the top half of your body and lift your shoulders. Do not use your back muscles. To make yourself more comfortable, you may change where you place your hands. 4. Stay in this position for 5 seconds. 5. Slowly return to lying flat on the floor.  Bridges  Do these steps 10 times in a row: 1. Lie on your back on a firm surface. 2. Bend your knees so they point up to the ceiling. Your feet should be flat on the floor. 3. Tighten your butt muscles and lift your butt off of the floor until your waist is almost as high as your knees. If you  do not feel the muscles working in your butt and the back of your thighs, slide your feet 1-2 inches farther away from your butt. 4. Stay in this position for 3-5 seconds. 5. Slowly lower your butt to the floor, and let your butt muscles relax.  If this exercise is too easy, try doing it with your arms crossed over your chest. Belly Crunches  Do these steps 5-10 times in a row: 1. Lie on your back on a firm bed or the floor with your legs stretched out. 2. Bend your knees so they point up to the ceiling. Your feet should be flat on the floor. 3. Cross your arms over your chest. 4. Tip your chin a little bit toward your chest but do not bend your neck. 5. Tighten your belly muscles and slowly raise your chest just enough to lift your shoulder blades a tiny bit off of the floor. 6. Slowly lower your  chest and your head to the floor.  Back Lifts Do these steps 5-10 times in a row: 1. Lie on your belly (face-down) with your arms at your sides, and rest your forehead on the floor. 2. Tighten the muscles in your legs and your butt. 3. Slowly lift your chest off of the floor while you keep your hips on the floor. Keep the back of your head in line with the curve in your back. Look at the floor while you do this. 4. Stay in this position for 3-5 seconds. 5. Slowly lower your chest and your face to the floor.  Contact a doctor if:  Your back pain gets a lot worse when you do an exercise.  Your back pain does not lessen 2 hours after you exercise. If you have any of these problems, stop doing the exercises. Do not do them again unless your doctor says it is okay. Get help right away if:  You have sudden, very bad back pain. If this happens, stop doing the exercises. Do not do them again unless your doctor says it is okay. This information is not intended to replace advice given to you by your health care provider. Make sure you discuss any questions you have with your health care provider. Document Released: 03/09/2010 Document Revised: 07/13/2015 Document Reviewed: 03/31/2014 Elsevier Interactive Patient Education  [employer].

## 2017-06-05 NOTE — Progress Notes (Signed)
Subjective:    Patient ID: Russell Shannon, male    DOB: 08-15-1950, 67 y.o.   MRN: [id number]  HPI  Pt in today for some recent left si area pain  that has been daily for about 3 weeks. No injury or fall. Pain level varies moderate to severe. At end of day pain is worse.  Pt is on allopurinol and recent uric acid one month ago upper range normal.   I called in tramadol and diclofenac early in month until he could come in. Sent the rx to [place] where he works.  On review no mid lumbar pain. No leg weakness. No incontinence. But doe have some pain radiating at time lt si area down to his foot.  When he was on diclofenac pain was moderately well controlled.     Review of Systems  Constitutional: Negative for chills and fatigue.  Respiratory: Negative for cough, chest tightness, shortness of breath and wheezing.   Cardiovascular: Negative for chest pain and palpitations.  Gastrointestinal: Negative for abdominal distention, abdominal pain, blood in stool, constipation, diarrhea, nausea and rectal pain.  Musculoskeletal: Positive for back pain.       Lt si area pain  Skin: Negative for rash.  Hematological: Negative for adenopathy. Does not bruise/bleed easily.  Psychiatric/Behavioral: Negative for behavioral problems and confusion.    Past Medical History:  Diagnosis Date  . Arthritis    hips  . Diabetes mellitus   . Gout   . Hyperlipidemia   . Hypertension      Social History   Socioeconomic History  . Marital status: Married    Spouse name: Not on file  . Number of children: Not on file  . Years of education: Not on file  . Highest education level: Not on file  Occupational History  . Not on file  Social Needs  . Financial resource strain: Not on file  . Food insecurity:    Worry: Not on file    Inability: Not on file  . Transportation needs:    Medical: Not on file    Non-medical: Not on file  Tobacco Use  . Smoking status: Never Smoker  . Smokeless  tobacco: Never Used  Substance and Sexual Activity  . Alcohol use: No  . Drug use: No  . Sexual activity: Yes  Lifestyle  . Physical activity:    Days per week: Not on file    Minutes per session: Not on file  . Stress: Not on file  Relationships  . Social connections:    Talks on phone: Not on file    Gets together: Not on file    Attends religious service: Not on file    Active member of club or organization: Not on file    Attends meetings of clubs or organizations: Not on file    Relationship status: Not on file  . Intimate partner violence:    Fear of current or ex partner: Not on file    Emotionally abused: Not on file    Physically abused: Not on file    Forced sexual activity: Not on file  Other Topics Concern  . Not on file  Social History Narrative  . Not on file    Past Surgical History:  Procedure Laterality Date  . APPENDECTOMY  1971  . laceration hand Left 1975  . LUNG BIOPSY  1980    Family History  Problem Relation Age of Onset  . Hypertension Mother   . Hypertension Father   .  Prostate cancer Brother   . Colon cancer Neg Hx   . Esophageal cancer Neg Hx   . Stomach cancer Neg Hx   . Rectal cancer Neg Hx   . Pancreatic cancer Neg Hx     No Known Allergies  Current Outpatient Medications on File Prior to Visit  Medication Sig Dispense Refill  . allopurinol (ZYLOPRIM) 100 MG tablet Take 1 tablet (100 mg total) by mouth daily. 30 tablet 6  . amLODipine (NORVASC) 10 MG tablet TAKE 1 TABLET (10 MG TOTAL) BY MOUTH DAILY. 90 tablet 1  . aspirin EC 81 MG tablet Take 81 mg by mouth daily.    . blood glucose meter kit and supplies Check Blood Sugar 1-2 times a day. 1 each 0  . Calcium Polycarbophil (FIBER-CAPS PO) Take by mouth daily.    . diclofenac (VOLTAREN) 75 MG EC tablet Take 1 tablet (75 mg total) by mouth 2 (two) times daily. 30 tablet 0  . hydrocortisone (ANUSOL-HC) 25 MG suppository Place 1 suppository (25 mg total) rectally 2 (two) times  daily. 14 suppository 0  . ibuprofen (ADVIL,MOTRIN) 800 MG tablet Take 800 mg by mouth 3 (three) times daily before meals.    Marland Kitchen losartan (COZAAR) 100 MG tablet Take 1 tablet (100 mg total) by mouth daily. 90 tablet 0  . metFORMIN (GLUCOPHAGE) 500 MG tablet TAKE 2 TABLETS TWICE DAILY 360 tablet 0  . omeprazole (PRILOSEC) 20 MG capsule Take 20 mg by mouth daily.    . simvastatin (ZOCOR) 40 MG tablet TAKE 1 TABLET AT BEDTIME 90 tablet 3  . traMADol (ULTRAM) 50 MG tablet Take 1 tablet (50 mg total) by mouth every 8 (eight) hours as needed. 8 tablet 0  . TRUE METRIX BLOOD GLUCOSE TEST test strip CHECK BLOOD SUGAR ONCE TO TWICE DAILY 100 each 2  . TRUEPLUS LANCETS 33G MISC CHECK BLOOD SUGAR  1  TO  2  TIMES  A  DAY 100 each 0   Current Facility-Administered Medications on File Prior to Visit  Medication Dose Route Frequency Provider Last Rate Last Dose  . 0.9 %  sodium chloride infusion  500 mL Intravenous Continuous Irene Shipper, MD        BP 130/81   Pulse 75   Temp 98.1 F (36.7 C) (Oral)   Resp 16   Ht '5\' 9"'$  (1.753 m)   Wt 206 lb 12.8 oz (93.8 kg)   SpO2 98%   BMI 30.54 kg/m       Objective:   Physical Exam  General Appearance- Not in acute distress.    Chest and Lung Exam Auscultation: Breath sounds:-Normal. Clear even and unlabored. Adventitious sounds:- No Adventitious sounds.  Cardiovascular Auscultation:Rythm - Regular, rate and rythm. Heart Sounds -Normal heart sounds.  Abdomen Inspection:-Inspection Normal.  Palpation/Perucssion: Palpation and Percussion of the abdomen reveal- Non Tender, No Rebound tenderness, No rigidity(Guarding) and No Palpable abdominal masses.  Liver:-Normal.  Spleen:- Normal.   Back No mid  lumbar spine tenderness to palpation. Lt si tenderness to palpation directly No pain on straight leg lift. Pain on lateral movements and flexion/extension of the spine.  Lower ext neurologic  L5-S1 sensation intact bilaterally. Normal patellar  reflexes bilaterally. No foot drop bilaterally.  Left hip- no pain on palpation or range of motion.      Assessment & Plan:  You do appear to have sciatica type pain.   Advise DC diclofenac.  Start meloxicam 7.5 mg 1-2 tab daily. Start flexeril tonight. Only  use at night.  Rx tramadol for more severe pain. Can get salon pas lidocaine patch otc and apply to area.  Back exercises as tolerated.  Follow up in 2 weeks or as needed If pain persists or become mid spinal then consider lumbar xray.  Clarified with patient just use flexeril at night. Explained accidentally put 3 times daily on script. Pt expressed understanding.  Mackie Pai, PA-C

## 2017-06-09 ENCOUNTER — Telehealth: Payer: Self-pay | Admitting: Medical

## 2017-06-09 NOTE — Telephone Encounter (Signed)
Copied from CRM [id number]. Topic: Quick Communication - See Telephone Encounter >> Jun 09, 2017  5:04 PM Floria RavelingStovall, Shana A wrote: CRM for notification. See Telephone encounter for: 06/09/17. Pt was seen on thurs 4/18 and needs a note for work.  He would like for his wife to come and pick it up tomorrow  Best number [phone]

## 2017-06-10 ENCOUNTER — Encounter: Payer: Self-pay | Admitting: Medical

## 2017-06-10 NOTE — Telephone Encounter (Signed)
Please advise 

## 2017-06-10 NOTE — Telephone Encounter (Signed)
You can write the note and stamp my signature provided he is not asking for more than 5 days. If asking for more then just update me on why needs so many days

## 2017-06-11 NOTE — Telephone Encounter (Signed)
Left message to return call. Ok for pec to find out when patient would need to return to work.

## 2017-06-17 NOTE — Telephone Encounter (Signed)
Letter was printed through my chart.

## 2017-06-20 ENCOUNTER — Ambulatory Visit (HOSPITAL_BASED_OUTPATIENT_CLINIC_OR_DEPARTMENT_OTHER)
Admission: RE | Admit: 2017-06-20 | Discharge: 2017-06-20 | Disposition: A | Discharge status: Home / Self Care | Payer: BLUE CROSS/BLUE SHIELD | Source: Ambulatory Visit | Attending: Medical | Admitting: Medical

## 2017-06-20 ENCOUNTER — Encounter: Payer: Self-pay | Admitting: Medical

## 2017-06-20 ENCOUNTER — Ambulatory Visit (INDEPENDENT_AMBULATORY_CARE_PROVIDER_SITE_OTHER): Payer: BLUE CROSS/BLUE SHIELD | Admitting: Medical

## 2017-06-20 VITALS — BP 134/79 | HR 69 | Temp 97.7°F | Resp 16 | Ht 69.0 in | Wt 217.0 lb

## 2017-06-20 DIAGNOSIS — M5442 Lumbago with sciatica, left side: Secondary | ICD-10-CM

## 2017-06-20 DIAGNOSIS — M5136 Other intervertebral disc degeneration, lumbar region: Secondary | ICD-10-CM | POA: Diagnosis not present

## 2017-06-20 MED ORDER — PREDNISONE 10 MG PO TABS: ORAL_TABLET | ORAL | 0 refills | Status: DC

## 2017-06-20 MED ORDER — TRAMADOL HCL 50 MG PO TABS: 50.0000 mg | ORAL_TABLET | Freq: Three times a day (TID) | ORAL | 0 refills | Status: DC | PRN

## 2017-06-20 MED ORDER — CYCLOBENZAPRINE HCL 5 MG PO TABS: 5.0000 mg | ORAL_TABLET | Freq: Every day | ORAL | 0 refills | Status: DC

## 2017-06-20 NOTE — Patient Instructions (Addendum)
For your lumbar back/lt si joint area pain will get xray today.  Will rx prednisone 5 day taper dose.(rx advisement given)  Will Rx flexeril muscle relaxant.  Will Rx tramadol as well.  Red flag signs and symptoms reviewed that would indicate need for ED eval.  Follow up in one week or as needed.(but call me if no improvement. Might go ahead and get you in with sports med.

## 2017-06-20 NOTE — Progress Notes (Signed)
Subjective:    Patient ID: Russell Shannon, male    DOB: 03/19/1950, 67 y.o.   MRN: [id number]  HPI  Pt in for follow up.  He is still have sciatica type pain. This is despite use of meloxicam and flexeril. He is describing more radicular pain. When he used pain medication it did ease.  Pain now running all the way down to his foot at time. But no leg weakness, incontinence or saddle anesthesia.  Pt has been able to work all week.   Review of Systems  Constitutional: Negative for chills, fatigue and fever.  Respiratory: Negative for cough, chest tightness, shortness of breath and wheezing.   Cardiovascular: Negative for chest pain and palpitations.  Gastrointestinal: Negative for abdominal pain.  Musculoskeletal: Positive for back pain.       See hpi.  Neurological:       Radicular pain. See hpi.  Psychiatric/Behavioral: Negative for behavioral problems and confusion.    Past Medical History:  Diagnosis Date  . Arthritis    hips  . Diabetes mellitus   . Gout   . Hyperlipidemia   . Hypertension      Social History   Socioeconomic History  . Marital status: Married    Spouse name: Not on file  . Number of children: Not on file  . Years of education: Not on file  . Highest education level: Not on file  Occupational History  . Not on file  Social Needs  . Financial resource strain: Not on file  . Food insecurity:    Worry: Not on file    Inability: Not on file  . Transportation needs:    Medical: Not on file    Non-medical: Not on file  Tobacco Use  . Smoking status: Never Smoker  . Smokeless tobacco: Never Used  Substance and Sexual Activity  . Alcohol use: No  . Drug use: No  . Sexual activity: Yes  Lifestyle  . Physical activity:    Days per week: Not on file    Minutes per session: Not on file  . Stress: Not on file  Relationships  . Social connections:    Talks on phone: Not on file    Gets together: Not on file    Attends religious service:  Not on file    Active member of club or organization: Not on file    Attends meetings of clubs or organizations: Not on file    Relationship status: Not on file  . Intimate partner violence:    Fear of current or ex partner: Not on file    Emotionally abused: Not on file    Physically abused: Not on file    Forced sexual activity: Not on file  Other Topics Concern  . Not on file  Social History Narrative  . Not on file    Past Surgical History:  Procedure Laterality Date  . APPENDECTOMY  1971  . laceration hand Left 1975  . LUNG BIOPSY  1980    Family History  Problem Relation Age of Onset  . Hypertension Mother   . Hypertension Father   . Prostate cancer Brother   . Colon cancer Neg Hx   . Esophageal cancer Neg Hx   . Stomach cancer Neg Hx   . Rectal cancer Neg Hx   . Pancreatic cancer Neg Hx     No Known Allergies  Current Outpatient Medications on File Prior to Visit  Medication Sig Dispense Refill  . allopurinol (  ZYLOPRIM) 100 MG tablet Take 1 tablet (100 mg total) by mouth daily. 30 tablet 6  . amLODipine (NORVASC) 10 MG tablet TAKE 1 TABLET (10 MG TOTAL) BY MOUTH DAILY. 90 tablet 1  . aspirin EC 81 MG tablet Take 81 mg by mouth daily.    . blood glucose meter kit and supplies Check Blood Sugar 1-2 times a day. 1 each 0  . Calcium Polycarbophil (FIBER-CAPS PO) Take by mouth daily.    . cyclobenzaprine (FLEXERIL) 5 MG tablet Take 1 tablet (5 mg total) by mouth 3 (three) times daily as needed for muscle spasms. 30 tablet 1  . hydrocortisone (ANUSOL-HC) 25 MG suppository Place 1 suppository (25 mg total) rectally 2 (two) times daily. 14 suppository 0  . losartan (COZAAR) 100 MG tablet Take 1 tablet (100 mg total) by mouth daily. 90 tablet 0  . meloxicam (MOBIC) 7.5 MG tablet 1-2 tab po q day 30 tablet 0  . metFORMIN (GLUCOPHAGE) 500 MG tablet TAKE 2 TABLETS TWICE DAILY 360 tablet 0  . omeprazole (PRILOSEC) 20 MG capsule Take 20 mg by mouth daily.    . simvastatin  (ZOCOR) 40 MG tablet TAKE 1 TABLET AT BEDTIME 90 tablet 3  . traMADol (ULTRAM) 50 MG tablet Take 1 tablet (50 mg total) by mouth every 8 (eight) hours as needed. 15 tablet 0  . TRUE METRIX BLOOD GLUCOSE TEST test strip CHECK BLOOD SUGAR ONCE TO TWICE DAILY 100 each 2  . TRUEPLUS LANCETS 33G MISC CHECK BLOOD SUGAR  1  TO  2  TIMES  A  DAY 100 each 0   Current Facility-Administered Medications on File Prior to Visit  Medication Dose Route Frequency Provider Last Rate Last Dose  . 0.9 %  sodium chloride infusion  500 mL Intravenous Continuous Irene Shipper, MD        BP 134/79   Pulse 69   Temp 97.7 F (36.5 C) (Oral)   Resp 16   Ht '5\' 9"'  (1.753 m)   Wt 217 lb (98.4 kg)   SpO2 99%   BMI 32.05 kg/m       Objective:   Physical Exam  General Appearance- Not in acute distress.    Chest and Lung Exam Auscultation: Breath sounds:-Normal. Clear even and unlabored. Adventitious sounds:- No Adventitious sounds.  Cardiovascular Auscultation:Rythm - Regular, rate and rythm. Heart Sounds -Normal heart sounds.  Abdomen Inspection:-Inspection Normal.  Palpation/Perucssion: Palpation and Percussion of the abdomen reveal- Non Tender, No Rebound tenderness, No rigidity(Guarding) and No Palpable abdominal masses.  Liver:-Normal.  Spleen:- Normal.   Back No mid  lumbar spine tenderness to palpation. Lt si tenderness to palpation directly No pain on straight leg lift. Pain on lateral movements and flexion/extension of the spine.  Lower ext neurologic  L5-S1 sensation intact bilaterally. Normal patellar reflexes bilaterally. No foot drop bilaterally.  Left hip- no pain on palpation or range of motion.      Assessment & Plan:  For your lumbar back/lt si joint area pain will get xray today.  Will rx prednisone 5 day taper dose.  Will Rx flexeril muscle relaxant.  Will Rx tramadol as well.  Red flag signs and symptoms reviewed that would indicate need for ED  eval.  Follow up in one week or as needed.(but call me if no improvement. Might go ahead and get you in with sports med.  Mackie Pai, PA-C

## 2017-07-21 ENCOUNTER — Ambulatory Visit (INDEPENDENT_AMBULATORY_CARE_PROVIDER_SITE_OTHER): Payer: BLUE CROSS/BLUE SHIELD | Admitting: Medical

## 2017-07-21 ENCOUNTER — Encounter: Payer: Self-pay | Admitting: Medical

## 2017-07-21 VITALS — BP 129/71 | HR 81 | Temp 98.0°F | Resp 16 | Ht 69.0 in | Wt 208.0 lb

## 2017-07-21 DIAGNOSIS — M79672 Pain in left foot: Secondary | ICD-10-CM | POA: Diagnosis not present

## 2017-07-21 DIAGNOSIS — R112 Nausea with vomiting, unspecified: Secondary | ICD-10-CM

## 2017-07-21 DIAGNOSIS — M109 Gout, unspecified: Secondary | ICD-10-CM | POA: Diagnosis not present

## 2017-07-21 LAB — COMPREHENSIVE METABOLIC PANEL
ALT: 25 U/L (ref 0–53)
AST: 28 U/L (ref 0–37)
Albumin: 4.5 g/dL (ref 3.5–5.2)
Alkaline Phosphatase: 84 U/L (ref 39–117)
BUN: 13 mg/dL (ref 6–23)
CO2: 29 mEq/L (ref 19–32)
Calcium: 9.3 mg/dL (ref 8.4–10.5)
Chloride: 104 mEq/L (ref 96–112)
Creatinine, Ser: 0.94 mg/dL (ref 0.40–1.50)
GFR: 102.6 mL/min (ref >60.00)
Glucose, Bld: 193 mg/dL — ABNORMAL HIGH (ref 70–99)
Potassium: 3.7 mEq/L (ref 3.5–5.1)
Sodium: 141 mEq/L (ref 135–145)
Total Bilirubin: 0.3 mg/dL (ref 0.2–1.2)
Total Protein: 7.1 g/dL (ref 6.0–8.3)

## 2017-07-21 LAB — CBC WITH DIFFERENTIAL/PLATELET
Basophils Absolute: 0 10*3/uL (ref 0.0–0.1)
Basophils Relative: 0.8 % (ref 0.0–3.0)
Eosinophils Absolute: 0.1 10*3/uL (ref 0.0–0.7)
Eosinophils Relative: 1.4 % (ref 0.0–5.0)
HCT: 37.8 % — ABNORMAL LOW (ref 39.0–52.0)
Hemoglobin: 12.4 g/dL — ABNORMAL LOW (ref 13.0–17.0)
Lymphocytes Relative: 31.7 % (ref 12.0–46.0)
Lymphs Abs: 1.5 10*3/uL (ref 0.7–4.0)
MCHC: 32.8 g/dL (ref 30.0–36.0)
MCV: 86.5 fl (ref 78.0–100.0)
Monocytes Absolute: 0.5 10*3/uL (ref 0.1–1.0)
Monocytes Relative: 10.5 % (ref 3.0–12.0)
Neutro Abs: 2.7 10*3/uL (ref 1.4–7.7)
Neutrophils Relative %: 55.6 % (ref 43.0–77.0)
Platelets: 141 10*3/uL — ABNORMAL LOW (ref 150.0–400.0)
RBC: 4.37 Mil/uL (ref 4.22–5.81)
RDW: 14 % (ref 11.5–15.5)
WBC: 4.8 10*3/uL (ref 4.0–10.5)

## 2017-07-21 LAB — AMYLASE: Amylase: 51 U/L (ref 27–131)

## 2017-07-21 LAB — LIPASE: Lipase: 34 U/L (ref 11.0–59.0)

## 2017-07-21 LAB — URIC ACID: Uric Acid, Serum: 7.1 mg/dL (ref 4.0–7.8)

## 2017-07-21 MED ORDER — ALLOPURINOL 300 MG PO TABS: 300.0000 mg | ORAL_TABLET | Freq: Every day | ORAL | 6 refills | Status: DC

## 2017-07-21 MED ORDER — PREDNISONE 10 MG PO TABS: ORAL_TABLET | ORAL | 0 refills | Status: DC

## 2017-07-21 NOTE — Patient Instructions (Addendum)
For likely acute gout flare, I am prescribing prednisone tablets.(taper dose). Choosing this in light of fact that did not respond to colchicine.  If any redness were to expand up your foot like infection let us know immediately.  Please get cbc, uric acid and cmp today.  For nausea that occurs intermittently will get amylase and lipase. You associate this with metformin and empty stomach. Try taking metformin with food. If more nauseu, vomiting or other GI symptoms please let us know.  Watch sugars while on taper prednisone. If you see any sugars above 200 please let me know.  Hold allopurinol now. But in 6 days can start higher dose.  Follow up 7-10 days or as needed

## 2017-07-21 NOTE — Progress Notes (Signed)
Subjective:    Patient ID: Russell Shannon, male    DOB: [birth year], 67 y.o.   MRN: [id number]  HPI  Pt in for left foot pain. Pain present medial aspect of foot. Pain came up at base of left great toe. Pt had been eating some tuna fish sandwiches for 2-3 days then foot pain started.   He has history of gout.  No fever, no chills or sweats.  Pt tried 3 days of colchicine twice a day. Pt states did not stop his toe pain.  Pt last uric acid was 7.1.  Pt kidney function looks good.  Pt is diabetic and and a1c. Pt last a1-c 6.6.  Yesterday his sugar was 129.      Review of Systems  Constitutional: Negative for chills, fatigue and fever.  Respiratory: Negative for chest tightness, shortness of breath and wheezing.   Cardiovascular: Negative for chest pain and palpitations.  Gastrointestinal: Positive for nausea and vomiting. Negative for abdominal pain and constipation.       Pt states rare occasional nausea and vomiting At most 1-2 times. Thinks maybe not eating food and taking metformin. But no diarrhea reported.  Musculoskeletal: Negative for back pain, neck pain and neck stiffness.       See hpi. Left great toe pain severe./mild foot pain.  Skin: Negative for rash.  Neurological: Negative for dizziness, speech difficulty, weakness and light-headedness.  Hematological: Negative for adenopathy. Does not bruise/bleed easily.  Psychiatric/Behavioral: Negative for behavioral problems and decreased concentration.   Past Medical History:  Diagnosis Date  . Arthritis    hips  . Diabetes mellitus   . Gout   . Hyperlipidemia   . Hypertension      Social History   Socioeconomic History  . Marital status: Married    Spouse name: Not on file  . Number of children: Not on file  . Years of education: Not on file  . Highest education level: Not on file  Occupational History  . Not on file  Social Needs  . Financial resource strain: Not on file  . Food insecurity:   Worry: Not on file    Inability: Not on file  . Transportation needs:    Medical: Not on file    Non-medical: Not on file  Tobacco Use  . Smoking status: Never Smoker  . Smokeless tobacco: Never Used  Substance and Sexual Activity  . Alcohol use: No  . Drug use: No  . Sexual activity: Yes  Lifestyle  . Physical activity:    Days per week: Not on file    Minutes per session: Not on file  . Stress: Not on file  Relationships  . Social connections:    Talks on phone: Not on file    Gets together: Not on file    Attends religious service: Not on file    Active member of club or organization: Not on file    Attends meetings of clubs or organizations: Not on file    Relationship status: Not on file  . Intimate partner violence:    Fear of current or ex partner: Not on file    Emotionally abused: Not on file    Physically abused: Not on file    Forced sexual activity: Not on file  Other Topics Concern  . Not on file  Social History Narrative  . Not on file    Past Surgical History:  Procedure Laterality Date  . APPENDECTOMY  1971  . laceration hand  Left 1975  . LUNG BIOPSY  1980    Family History  Problem Relation Age of Onset  . Hypertension Mother   . Hypertension Father   . Prostate cancer Brother   . Colon cancer Neg Hx   . Esophageal cancer Neg Hx   . Stomach cancer Neg Hx   . Rectal cancer Neg Hx   . Pancreatic cancer Neg Hx     No Known Allergies  Current Outpatient Medications on File Prior to Visit  Medication Sig Dispense Refill  . allopurinol (ZYLOPRIM) 100 MG tablet Take 1 tablet (100 mg total) by mouth daily. 30 tablet 6  . amLODipine (NORVASC) 10 MG tablet TAKE 1 TABLET (10 MG TOTAL) BY MOUTH DAILY. 90 tablet 1  . aspirin EC 81 MG tablet Take 81 mg by mouth daily.    . blood glucose meter kit and supplies Check Blood Sugar 1-2 times a day. 1 each 0  . Calcium Polycarbophil (FIBER-CAPS PO) Take by mouth daily.    . cyclobenzaprine (FLEXERIL) 5 MG  tablet Take 1 tablet (5 mg total) by mouth at bedtime. 10 tablet 0  . hydrocortisone (ANUSOL-HC) 25 MG suppository Place 1 suppository (25 mg total) rectally 2 (two) times daily. 14 suppository 0  . losartan (COZAAR) 100 MG tablet Take 1 tablet (100 mg total) by mouth daily. 90 tablet 0  . meloxicam (MOBIC) 7.5 MG tablet 1-2 tab po q day 30 tablet 0  . metFORMIN (GLUCOPHAGE) 500 MG tablet TAKE 2 TABLETS TWICE DAILY 360 tablet 0  . omeprazole (PRILOSEC) 20 MG capsule Take 20 mg by mouth daily.    . predniSONE (DELTASONE) 10 MG tablet 5 TAB PO DAY 1 4 TAB PO DAY 2 3 TAB PO DAY 3 2 TAB PO DAY 4 1 TAB PO DAY 5 15 tablet 0  . simvastatin (ZOCOR) 40 MG tablet TAKE 1 TABLET AT BEDTIME 90 tablet 3  . traMADol (ULTRAM) 50 MG tablet Take 1 tablet (50 mg total) by mouth every 8 (eight) hours as needed. 15 tablet 0  . TRUE METRIX BLOOD GLUCOSE TEST test strip CHECK BLOOD SUGAR ONCE TO TWICE DAILY 100 each 2  . TRUEPLUS LANCETS 33G MISC CHECK BLOOD SUGAR  1  TO  2  TIMES  A  DAY 100 each 0   Current Facility-Administered Medications on File Prior to Visit  Medication Dose Route Frequency Provider Last Rate Last Dose  . 0.9 %  sodium chloride infusion  500 mL Intravenous Continuous Irene Shipper, MD        BP 129/71   Pulse 81   Temp 98 F (36.7 C) (Oral)   Resp 16   Ht '5\' 9"'  (1.753 m)   Wt 208 lb (94.3 kg)   SpO2 100%   BMI 30.72 kg/m       Objective:   Physical Exam  General Mental Status- Alert. General Appearance- Not in acute distress.   Skin General: Color- Normal Color. Moisture- Normal Moisture.  Neck Carotid Arteries- Normal color. Moisture- Normal Moisture. No carotid bruits. No JVD.  Chest and Lung Exam Auscultation: Breath Sounds:-Normal.  Cardiovascular Auscultation:Rythm- Regular. Murmurs & Other Heart Sounds:Auscultation of the heart reveals- No Murmurs.  Abdomen Inspection:-Inspeection Normal. Palpation/Percussion:Note:No mass. Palpation and Percussion of the  abdomen reveal- Non Tender, Non Distended + BS, no rebound or guarding.    Neurologic Cranial Nerve exam:- CN III-XII intact(No nystagmus), symmetric smile. Strength:- 5/5 equal and symmetric strength both upper and lower extremities.  Left  foot- mild swollen and mild pinkish red from base of left great toe to mid foot/medial aspect. Medial foot not tender but at base of left toe very tender to light tight. Mild warmth.     Assessment & Plan:  For likely acute gout flare, I am prescribing prednisone tablets.(taper dose). Choosing this in light of fact that did not respond to colchicine.  If any redness were to expand up your foot like infection let us know immediately.  Please get cbc, uric acid and cmp today.  For nausea that occurs intermittently will get amylase and lipase. You associate this with metformin and empty stomach. Try taking metformin with food. If more nauseu, vomiting or other GI symptoms please let us know.  Watch sugars while on taper prednisone. If you see any sugars above 200 please let me know.  Hold allopurinol now. But in 6 days can start higher dose.  Follow up 7-10 days or as needed  [employer], [employer]

## 2017-07-22 ENCOUNTER — Telehealth: Payer: Self-pay | Admitting: Medical

## 2017-07-22 NOTE — Telephone Encounter (Signed)
You can give him humalog or novolog. Insulin pen. To use per sliding scale. If sugars less than 200 would not use. Give him printed sliding scale.

## 2017-07-22 NOTE — Telephone Encounter (Signed)
Patient stts something was suppose to be left at the front office to adv him how to eat and etc... Patient work note was up here which patient pick up. Patient would like for you to call him in regarding info. [phone]

## 2017-08-04 ENCOUNTER — Other Ambulatory Visit: Payer: Self-pay | Admitting: Medical

## 2017-09-01 ENCOUNTER — Other Ambulatory Visit: Payer: Self-pay | Admitting: Medical

## 2017-10-09 ENCOUNTER — Other Ambulatory Visit: Payer: Self-pay | Admitting: Medical

## 2017-10-17 ENCOUNTER — Ambulatory Visit (HOSPITAL_BASED_OUTPATIENT_CLINIC_OR_DEPARTMENT_OTHER)
Admission: RE | Admit: 2017-10-17 | Discharge: 2017-10-17 | Disposition: A | Discharge status: Home / Self Care | Payer: BLUE CROSS/BLUE SHIELD | Source: Ambulatory Visit | Attending: Medical | Admitting: Medical

## 2017-10-17 ENCOUNTER — Encounter: Payer: Self-pay | Admitting: Medical

## 2017-10-17 ENCOUNTER — Ambulatory Visit (INDEPENDENT_AMBULATORY_CARE_PROVIDER_SITE_OTHER): Payer: BLUE CROSS/BLUE SHIELD | Admitting: Medical

## 2017-10-17 VITALS — BP 144/80 | HR 60 | Temp 97.5°F | Resp 16 | Ht 69.0 in | Wt 202.0 lb

## 2017-10-17 DIAGNOSIS — E119 Type 2 diabetes mellitus without complications: Secondary | ICD-10-CM

## 2017-10-17 DIAGNOSIS — M5442 Lumbago with sciatica, left side: Secondary | ICD-10-CM

## 2017-10-17 MED ORDER — CYCLOBENZAPRINE HCL 5 MG PO TABS: 5.0000 mg | ORAL_TABLET | Freq: Every day | ORAL | 0 refills | Status: DC

## 2017-10-17 MED ORDER — PREDNISONE 10 MG PO TABS: ORAL_TABLET | ORAL | 0 refills | Status: DC

## 2017-10-17 MED ORDER — KETOROLAC TROMETHAMINE 60 MG/2ML IM SOLN
60.0000 mg | Freq: Once | INTRAMUSCULAR | Status: AC
  Administered 2017-10-17: 60 mg via INTRAMUSCULAR

## 2017-10-17 NOTE — Patient Instructions (Signed)
You do appear to have recent left side sciatica type pain.  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  We gave you Toradol 60 mg IM injection today.  I do want to repeat the lumbar spine x-ray compared to the last years x-ray.  Starting tomorrow you can begin a 5-day taper of prednisone course.  Provided you with a low dose of Flexeril 5 mg to use at night.  Continue the gabapentin at the urgent care prescribed.  While on the 5-day course of prednisone continue your metformin and eat low sugar diet.  Please get metabolic panel and A1c today.  Red flag signs and symptoms reviewed that would indicate need to be seen in the emergency department.  Once your pain eases up can start back exercises.  Follow-up in 7 to 10 days or as needed.   Back Exercises If you have pain in your back, do these exercises 2-3 times each day or as told by your doctor. When the pain goes away, do the exercises once each day, but repeat the steps more times for each exercise (do more repetitions). If you do not have pain in your back, do these exercises once each day or as told by your doctor. Exercises Single Knee to Chest  Do these steps 3-5 times in a row for each leg: 1. Lie on your back on a firm bed or the floor with your legs stretched out. 2. Bring one knee to your chest. 3. Hold your knee to your chest by grabbing your knee or thigh. 4. Pull on your knee until you feel a gentle stretch in your lower back. 5. Keep doing the stretch for 10-30 seconds. 6. Slowly let go of your leg and straighten it.  Pelvic Tilt  Do these steps 5-10 times in a row: 1. Lie on your back on a firm bed or the floor with your legs stretched out. 2. Bend your knees so they point up to the ceiling. Your feet should be flat on the floor. 3. Tighten your lower belly (abdomen) muscles to press your lower back against the floor. This will make your tailbone point up to the ceiling instead of pointing down to your feet  or the floor. 4. Stay in this position for 5-10 seconds while you gently tighten your muscles and breathe evenly.  Cat-Cow  Do these steps until your lower back bends more easily: 1. Get on your hands and knees on a firm surface. Keep your hands under your shoulders, and keep your knees under your hips. You may put padding under your knees. 2. Let your head hang down, and make your tailbone point down to the floor so your lower back is round like the back of a cat. 3. Stay in this position for 5 seconds. 4. Slowly lift your head and make your tailbone point up to the ceiling so your back hangs low (sags) like the back of a cow. 5. Stay in this position for 5 seconds.  Press-Ups  Do these steps 5-10 times in a row: 1. Lie on your belly (face-down) on the floor. 2. Place your hands near your head, about shoulder-width apart. 3. While you keep your back relaxed and keep your hips on the floor, slowly straighten your arms to raise the top half of your body and lift your shoulders. Do not use your back muscles. To make yourself more comfortable, you may change where you place your hands. 4. Stay in this position for 5  seconds. 5. Slowly return to lying flat on the floor.  Bridges  Do these steps 10 times in a row: 1. Lie on your back on a firm surface. 2. Bend your knees so they point up to the ceiling. Your feet should be flat on the floor. 3. Tighten your butt muscles and lift your butt off of the floor until your waist is almost as high as your knees. If you do not feel the muscles working in your butt and the back of your thighs, slide your feet 1-2 inches farther away from your butt. 4. Stay in this position for 3-5 seconds. 5. Slowly lower your butt to the floor, and let your butt muscles relax.  If this exercise is too easy, try doing it with your arms crossed over your chest. Belly Crunches  Do these steps 5-10 times in a row: 1. Lie on your back on a firm bed or the floor with  your legs stretched out. 2. Bend your knees so they point up to the ceiling. Your feet should be flat on the floor. 3. Cross your arms over your chest. 4. Tip your chin a little bit toward your chest but do not bend your neck. 5. Tighten your belly muscles and slowly raise your chest just enough to lift your shoulder blades a tiny bit off of the floor. 6. Slowly lower your chest and your head to the floor.  Back Lifts Do these steps 5-10 times in a row: 1. Lie on your belly (face-down) with your arms at your sides, and rest your forehead on the floor. 2. Tighten the muscles in your legs and your butt. 3. Slowly lift your chest off of the floor while you keep your hips on the floor. Keep the back of your head in line with the curve in your back. Look at the floor while you do this. 4. Stay in this position for 3-5 seconds. 5. Slowly lower your chest and your face to the floor.  Contact a doctor if:  Your back pain gets a lot worse when you do an exercise.  Your back pain does not lessen 2 hours after you exercise. If you have any of these problems, stop doing the exercises. Do not do them again unless your doctor says it is okay. Get help right away if:  You have sudden, very bad back pain. If this happens, stop doing the exercises. Do not do them again unless your doctor says it is okay. This information is not intended to replace advice given to you by your health care provider. Make sure you discuss any questions you have with your health care provider. Document Released: 03/09/2010 Document Revised: 07/13/2015 Document Reviewed: 03/31/2014 Elsevier Interactive Patient Education  [employer].

## 2017-10-17 NOTE — Progress Notes (Signed)
Subjective:    Patient ID: Russell Shannon, male    DOB: Oct 28, 1950, 67 y.o.   MRN: [id number]  HPI  Pt in for left si area  for 3-4 weeks. Initially MA put in hip but pain in SI area. He states some pain that runs toward left buttox area and then down back of his leg.  Pt states he went to provider in [place] where he works and given medication for pain. Pt was given muscle relaxant flexeril and gabapentin.  Pt had old prescription of tramadol but ran out recently.  Pt state pain in left SI area is daily. Meds urgent care gave him not really helping.  No falls or trauma.  No leg weakness. No incontinence.  Pt sugar this am 140.     Review of Systems  Constitutional: Negative for chills, fatigue and fever.  Respiratory: Negative for cough, chest tightness, shortness of breath and wheezing.   Cardiovascular: Negative for chest pain and palpitations.  Gastrointestinal: Negative for abdominal pain.  Musculoskeletal: Positive for back pain.       Si area pain.  Skin: Negative for rash.  Hematological: Negative for adenopathy. Does not bruise/bleed easily.  Psychiatric/Behavioral: Negative for behavioral problems and confusion.   Past Medical History:  Diagnosis Date  . Arthritis    hips  . Diabetes mellitus   . Gout   . Hyperlipidemia   . Hypertension      Social History   Socioeconomic History  . Marital status: Married    Spouse name: Not on file  . Number of children: Not on file  . Years of education: Not on file  . Highest education level: Not on file  Occupational History  . Not on file  Social Needs  . Financial resource strain: Not on file  . Food insecurity:    Worry: Not on file    Inability: Not on file  . Transportation needs:    Medical: Not on file    Non-medical: Not on file  Tobacco Use  . Smoking status: Never Smoker  . Smokeless tobacco: Never Used  Substance and Sexual Activity  . Alcohol use: No  . Drug use: No  . Sexual activity:  Yes  Lifestyle  . Physical activity:    Days per week: Not on file    Minutes per session: Not on file  . Stress: Not on file  Relationships  . Social connections:    Talks on phone: Not on file    Gets together: Not on file    Attends religious service: Not on file    Active member of club or organization: Not on file    Attends meetings of clubs or organizations: Not on file    Relationship status: Not on file  . Intimate partner violence:    Fear of current or ex partner: Not on file    Emotionally abused: Not on file    Physically abused: Not on file    Forced sexual activity: Not on file  Other Topics Concern  . Not on file  Social History Narrative  . Not on file    Past Surgical History:  Procedure Laterality Date  . APPENDECTOMY  1971  . laceration hand Left 1975  . LUNG BIOPSY  1980    Family History  Problem Relation Age of Onset  . Hypertension Mother   . Hypertension Father   . Prostate cancer Brother   . Colon cancer Neg Hx   . Esophageal  cancer Neg Hx   . Stomach cancer Neg Hx   . Rectal cancer Neg Hx   . Pancreatic cancer Neg Hx     No Known Allergies  Current Outpatient Medications on File Prior to Visit  Medication Sig Dispense Refill  . allopurinol (ZYLOPRIM) 300 MG tablet Take 1 tablet (300 mg total) by mouth daily. 30 tablet 6  . amLODipine (NORVASC) 10 MG tablet TAKE 1 TABLET EVERY DAY 90 tablet 1  . aspirin EC 81 MG tablet Take 81 mg by mouth daily.    . blood glucose meter kit and supplies Check Blood Sugar 1-2 times a day. 1 each 0  . Calcium Polycarbophil (FIBER-CAPS PO) Take by mouth daily.    . cyclobenzaprine (FLEXERIL) 5 MG tablet Take 1 tablet (5 mg total) by mouth at bedtime. 10 tablet 0  . hydrocortisone (ANUSOL-HC) 25 MG suppository Place 1 suppository (25 mg total) rectally 2 (two) times daily. 14 suppository 0  . meloxicam (MOBIC) 7.5 MG tablet 1-2 tab po q day 30 tablet 0  . metFORMIN (GLUCOPHAGE) 500 MG tablet TAKE 2 TABLETS  TWICE DAILY 360 tablet 0  . omeprazole (PRILOSEC) 20 MG capsule Take 20 mg by mouth daily.    . predniSONE (DELTASONE) 10 MG tablet 5 TAB PO DAY 1 4 TAB PO DAY 2 3 TAB PO DAY 3 2 TAB PO DAY 4 1 TAB PO DAY 5 15 tablet 0  . simvastatin (ZOCOR) 40 MG tablet TAKE 1 TABLET AT BEDTIME 90 tablet 3  . traMADol (ULTRAM) 50 MG tablet Take 1 tablet (50 mg total) by mouth every 8 (eight) hours as needed. 15 tablet 0  . TRUE METRIX BLOOD GLUCOSE TEST test strip CHECK BLOOD SUGAR ONCE TO TWICE DAILY 100 each 2  . TRUEPLUS LANCETS 33G MISC CHECK BLOOD SUGAR  1  TO  2  TIMES  A  DAY 100 each 0   Current Facility-Administered Medications on File Prior to Visit  Medication Dose Route Frequency Provider Last Rate Last Dose  . 0.9 %  sodium chloride infusion  500 mL Intravenous Continuous Irene Shipper, MD        BP (!) 144/80 (BP Location: Right Arm, Patient Position: Sitting, Cuff Size: Normal)   Pulse 60   Temp (!) 97.5 F (36.4 C) (Oral)   Resp 16   Ht '5\' 9"'  (1.753 m)   Wt 202 lb (91.6 kg)   SpO2 98%   BMI 29.83 kg/m       Objective:   Physical Exam  General Appearance- Not in acute distress.    Chest and Lung Exam Auscultation: Breath sounds:-Normal. Clear even and unlabored. Adventitious sounds:- No Adventitious sounds.  Cardiovascular Auscultation:Rythm - Regular, rate and rythm. Heart Sounds -Normal heart sounds.  Abdomen Inspection:-Inspection Normal.  Palpation/Perucssion: Palpation and Percussion of the abdomen reveal- Non Tender, No Rebound tenderness, No rigidity(Guarding) and No Palpable abdominal masses.  Liver:-Normal.  Spleen:- Normal.   Back Faint Mid lumbar spine tenderness to palpation. Pain more directly in left si area and toward left mid buttock. Pain on straight leg lift. Pain on lateral movements and flexion/extension of the spine.  Lower ext neurologic  L5-S1 sensation intact bilaterally. Normal patellar reflexes bilaterally. No foot drop  bilaterally.  Left hip- no pain on rom or palpation.     Assessment & Plan:  You do appear to have recent left side sciatica type pain.  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  We gave  you Toradol 60 mg IM injection today.  I do want to repeat the lumbar spine x-ray compared to the last years x-ray.  Starting tomorrow you can begin a 5-day taper of prednisone course.  Provided you with a low dose of Flexeril 5 mg to use at night.  Continue the gabapentin at the urgent care prescribed.  While on the 5-day course of prednisone continue your metformin and eat low sugar diet.  Please get metabolic panel and M6K today.  Red flag signs and symptoms reviewed that would indicate need to be seen in the emergency department.  Once your pain eases up can start back exercises.  Follow-up in 7 to 10 days or as needed.  Mackie Pai, PA-C

## 2017-10-24 ENCOUNTER — Other Ambulatory Visit (INDEPENDENT_AMBULATORY_CARE_PROVIDER_SITE_OTHER): Payer: BLUE CROSS/BLUE SHIELD

## 2017-10-24 DIAGNOSIS — E119 Type 2 diabetes mellitus without complications: Secondary | ICD-10-CM

## 2017-10-24 LAB — COMPREHENSIVE METABOLIC PANEL
ALT: 18 U/L (ref 0–53)
AST: 16 U/L (ref 0–37)
Albumin: 4.1 g/dL (ref 3.5–5.2)
Alkaline Phosphatase: 64 U/L (ref 39–117)
BUN: 17 mg/dL (ref 6–23)
CO2: 28 mEq/L (ref 19–32)
Calcium: 9.2 mg/dL (ref 8.4–10.5)
Chloride: 104 mEq/L (ref 96–112)
Creatinine, Ser: 1 mg/dL (ref 0.40–1.50)
GFR: 95.45 mL/min (ref >60.00)
Glucose, Bld: 158 mg/dL — ABNORMAL HIGH (ref 70–99)
Potassium: 3.9 mEq/L (ref 3.5–5.1)
Sodium: 140 mEq/L (ref 135–145)
Total Bilirubin: 0.4 mg/dL (ref 0.2–1.2)
Total Protein: 6.8 g/dL (ref 6.0–8.3)

## 2017-10-24 LAB — HEMOGLOBIN A1C: Hgb A1c MFr Bld: 7.9 % — ABNORMAL HIGH (ref 4.6–6.5)

## 2017-10-26 ENCOUNTER — Telehealth: Payer: Self-pay | Admitting: Medical

## 2017-10-26 MED ORDER — SAXAGLIPTIN HCL 5 MG PO TABS: 5.0000 mg | ORAL_TABLET | Freq: Every day | ORAL | 3 refills | Status: DC

## 2017-10-26 NOTE — Telephone Encounter (Signed)
Rx onglyza sent to pt pharmacy. 

## 2017-10-27 ENCOUNTER — Other Ambulatory Visit: Payer: Self-pay

## 2017-10-27 MED ORDER — SAXAGLIPTIN HCL 5 MG PO TABS: 5.0000 mg | ORAL_TABLET | Freq: Every day | ORAL | 3 refills | Status: DC

## 2017-11-21 ENCOUNTER — Ambulatory Visit: Payer: BLUE CROSS/BLUE SHIELD | Admitting: Medical

## 2018-01-12 ENCOUNTER — Encounter: Payer: Self-pay | Admitting: Medical

## 2018-01-12 ENCOUNTER — Ambulatory Visit (INDEPENDENT_AMBULATORY_CARE_PROVIDER_SITE_OTHER): Payer: BLUE CROSS/BLUE SHIELD | Admitting: Medical

## 2018-01-12 VITALS — BP 137/73 | HR 72 | Temp 98.4°F | Resp 16 | Ht 69.0 in | Wt 204.0 lb

## 2018-01-12 DIAGNOSIS — Z8739 Personal history of other diseases of the musculoskeletal system and connective tissue: Secondary | ICD-10-CM | POA: Diagnosis not present

## 2018-01-12 DIAGNOSIS — M25552 Pain in left hip: Secondary | ICD-10-CM

## 2018-01-12 LAB — COMPREHENSIVE METABOLIC PANEL
ALT: 19 U/L (ref 0–53)
AST: 22 U/L (ref 0–37)
Albumin: 4 g/dL (ref 3.5–5.2)
Alkaline Phosphatase: 49 U/L (ref 39–117)
BUN: 14 mg/dL (ref 6–23)
CO2: 28 mEq/L (ref 19–32)
Calcium: 9.2 mg/dL (ref 8.4–10.5)
Chloride: 106 mEq/L (ref 96–112)
Creatinine, Ser: 0.95 mg/dL (ref 0.40–1.50)
GFR: 101.21 mL/min (ref >60.00)
Glucose, Bld: 130 mg/dL — ABNORMAL HIGH (ref 70–99)
Potassium: 3.5 mEq/L (ref 3.5–5.1)
Sodium: 141 mEq/L (ref 135–145)
Total Bilirubin: 0.4 mg/dL (ref 0.2–1.2)
Total Protein: 6.5 g/dL (ref 6.0–8.3)

## 2018-01-12 LAB — URIC ACID: Uric Acid, Serum: 6 mg/dL (ref 4.0–7.8)

## 2018-01-12 MED ORDER — MELOXICAM 7.5 MG PO TABS: ORAL_TABLET | ORAL | 0 refills | Status: DC

## 2018-01-12 NOTE — Progress Notes (Signed)
Subjective:    Patient ID: Russell Shannon, male    DOB: Jun 03, 1950, 67 y.o.   MRN: [id number]  HPI  Pt in for acute visit for hip pain.   Pt had pain in left hip in early ocotober. Xray was negative. But he has had pain for 6 months prior.  Pt state pain briefly better after I saw him but pain came back and has not eased up.  Pain even at rest most days but not today.  Pt does have gout.  Pt has been taking alleve for pain. Also tried tramadol occasional. Early on alleve helped early on  but not now.  No pain presently. He state pain mostly after his work. Walking in parking lot and when he gets home.    Review of Systems  Constitutional: Negative for chills and fatigue.  Respiratory: Negative for cough, chest tightness and wheezing.   Cardiovascular: Negative for palpitations.  Gastrointestinal: Negative for abdominal pain.  Musculoskeletal: Negative for back pain.       Hip pain.  Skin: Negative for rash.  Neurological: Negative for dizziness, numbness and headaches.  Hematological: Negative for adenopathy. Does not bruise/bleed easily.  Psychiatric/Behavioral: Negative for behavioral problems, confusion and sleep disturbance. The patient is not nervous/anxious and is not hyperactive.     Past Medical History:  Diagnosis Date  . Arthritis    hips  . Diabetes mellitus   . Gout   . Hyperlipidemia   . Hypertension      Social History   Socioeconomic History  . Marital status: Married    Spouse name: Not on file  . Number of children: Not on file  . Years of education: Not on file  . Highest education level: Not on file  Occupational History  . Not on file  Social Needs  . Financial resource strain: Not on file  . Food insecurity:    Worry: Not on file    Inability: Not on file  . Transportation needs:    Medical: Not on file    Non-medical: Not on file  Tobacco Use  . Smoking status: Never Smoker  . Smokeless tobacco: Never Used  Substance and  Sexual Activity  . Alcohol use: No  . Drug use: No  . Sexual activity: Yes  Lifestyle  . Physical activity:    Days per week: Not on file    Minutes per session: Not on file  . Stress: Not on file  Relationships  . Social connections:    Talks on phone: Not on file    Gets together: Not on file    Attends religious service: Not on file    Active member of club or organization: Not on file    Attends meetings of clubs or organizations: Not on file    Relationship status: Not on file  . Intimate partner violence:    Fear of current or ex partner: Not on file    Emotionally abused: Not on file    Physically abused: Not on file    Forced sexual activity: Not on file  Other Topics Concern  . Not on file  Social History Narrative  . Not on file    Past Surgical History:  Procedure Laterality Date  . APPENDECTOMY  1971  . laceration hand Left 1975  . LUNG BIOPSY  1980    Family History  Problem Relation Age of Onset  . Hypertension Mother   . Hypertension Father   . Prostate cancer Brother   .  Colon cancer Neg Hx   . Esophageal cancer Neg Hx   . Stomach cancer Neg Hx   . Rectal cancer Neg Hx   . Pancreatic cancer Neg Hx     No Known Allergies  Current Outpatient Medications on File Prior to Visit  Medication Sig Dispense Refill  . allopurinol (ZYLOPRIM) 300 MG tablet Take 1 tablet (300 mg total) by mouth daily. 30 tablet 6  . amLODipine (NORVASC) 10 MG tablet TAKE 1 TABLET EVERY DAY 90 tablet 1  . aspirin EC 81 MG tablet Take 81 mg by mouth daily.    . blood glucose meter kit and supplies Check Blood Sugar 1-2 times a day. 1 each 0  . Calcium Polycarbophil (FIBER-CAPS PO) Take by mouth daily.    . cyclobenzaprine (FLEXERIL) 5 MG tablet Take 1 tablet (5 mg total) by mouth at bedtime. 10 tablet 0  . hydrocortisone (ANUSOL-HC) 25 MG suppository Place 1 suppository (25 mg total) rectally 2 (two) times daily. 14 suppository 0  . meloxicam (MOBIC) 7.5 MG tablet 1-2 tab po  q day 30 tablet 0  . metFORMIN (GLUCOPHAGE) 500 MG tablet TAKE 2 TABLETS TWICE DAILY 360 tablet 0  . omeprazole (PRILOSEC) 20 MG capsule Take 20 mg by mouth daily.    . predniSONE (DELTASONE) 10 MG tablet 5 TAB PO DAY 1 4 TAB PO DAY 2 3 TAB PO DAY 3 2 TAB PO DAY 4 1 TAB PO DAY 5 15 tablet 0  . saxagliptin HCl (ONGLYZA) 5 MG TABS tablet Take 1 tablet (5 mg total) by mouth daily. 30 tablet 3  . simvastatin (ZOCOR) 40 MG tablet TAKE 1 TABLET AT BEDTIME 90 tablet 3  . traMADol (ULTRAM) 50 MG tablet Take 1 tablet (50 mg total) by mouth every 8 (eight) hours as needed. 15 tablet 0  . TRUE METRIX BLOOD GLUCOSE TEST test strip CHECK BLOOD SUGAR ONCE TO TWICE DAILY 100 each 2  . TRUEPLUS LANCETS 33G MISC CHECK BLOOD SUGAR  1  TO  2  TIMES  A  DAY 100 each 0   Current Facility-Administered Medications on File Prior to Visit  Medication Dose Route Frequency Provider Last Rate Last Dose  . 0.9 %  sodium chloride infusion  500 mL Intravenous Continuous Perry, John N, MD        BP 137/73 (BP Location: Left Arm, Patient Position: Sitting, Cuff Size: Small)   Pulse 72   Temp 98.4 F (36.9 C) (Oral)   Resp 16   Ht 5' 9" (1.753 m)   Wt 204 lb (92.5 kg)   SpO2 99%   BMI 30.13 kg/m       Objective:   Physical Exam  General Mental Status- Alert. General Appearance- Not in acute distress.   Skin General: Color- Normal Color. Moisture- Normal Moisture.  Neck Carotid Arteries- Normal color. Moisture- Normal Moisture. No carotid bruits. No JVD.  Chest and Lung Exam Auscultation: Breath Sounds:-Normal.  Cardiovascular Auscultation:Rythm- Regular. Murmurs & Other Heart Sounds:Auscultation of the heart reveals- No Murmurs.  Abdomen Inspection:-Inspeection Normal. Palpation/Percussion:Note:No mass. Palpation and Percussion of the abdomen reveal- Non Tender, Non Distended + BS, no rebound or guarding.  Neurologic Cranial Nerve exam:- CN III-XII intact(No nystagmus), symmetric  smile. Strength:- 5/5 equal and symmetric strength both upper and lower extremities.  Left hip- no pain on rom  Presently and no pain palpation. No crepitus.      Assessment & Plan:  For history of hip pain with negative xray will   stop any alleve and will refill you meloxicam.  Since xray is negative will refer to sport med to evaluate soft tissue cause of pain such as bursitis.  Will check uric acid level today as well.  Recommend mid december come in fasting for htn, diabetes and lipid panel review. Recommend fast 8 hour prior to that appointment.  Follow up 3 weeks or as needed  [employer], PA-C

## 2018-01-12 NOTE — Patient Instructions (Addendum)
For history of hip pain with negative xray will stop any alleve and will refill you meloxicam.  Since xray is negative will refer to sport med to evaluate soft tissue cause of pain such as bursitis.  Will check uric acid level today as well.  Recommend mid december come in fasting for htn, diabetes and lipid panel review. Recommend fast 8 hour prior to that appointment.   Follow up 3 weeks or as needed

## 2018-01-23 ENCOUNTER — Ambulatory Visit (INDEPENDENT_AMBULATORY_CARE_PROVIDER_SITE_OTHER): Payer: BLUE CROSS/BLUE SHIELD | Admitting: Family Medicine

## 2018-01-23 ENCOUNTER — Encounter: Payer: Self-pay | Admitting: Family Medicine

## 2018-01-23 VITALS — BP 131/81 | HR 79 | Ht 69.0 in | Wt 204.0 lb

## 2018-01-23 DIAGNOSIS — M545 Low back pain: Secondary | ICD-10-CM

## 2018-01-23 DIAGNOSIS — M79605 Pain in left leg: Secondary | ICD-10-CM

## 2018-01-23 MED ORDER — PREDNISONE 10 MG PO TABS: ORAL_TABLET | ORAL | 0 refills | Status: DC

## 2018-01-23 NOTE — Progress Notes (Signed)
PCP: Mackie Pai, PA-C  Subjective:   HPI: Patient is a 67 y.o. male here for left back/hip pain.  Patient reports for about 1 year he's had pain posterior aspect of left low back and hip radiating down left leg. Associated with numbness and tingling as well past the knee. Pain level is 2-3/10 but up to 10/10 and sharp at worst. Tried flexeril without much benefit. Has not tried physical therapy. No skin changes. No bowel/bladder dysfunction.  Past Medical History:  Diagnosis Date  . Arthritis    hips  . Diabetes mellitus   . Gout   . Hyperlipidemia   . Hypertension     Current Outpatient Medications on File Prior to Visit  Medication Sig Dispense Refill  . allopurinol (ZYLOPRIM) 300 MG tablet Take 1 tablet (300 mg total) by mouth daily. 30 tablet 6  . amLODipine (NORVASC) 10 MG tablet TAKE 1 TABLET EVERY DAY 90 tablet 1  . aspirin EC 81 MG tablet Take 81 mg by mouth daily.    . blood glucose meter kit and supplies Check Blood Sugar 1-2 times a day. 1 each 0  . Calcium Polycarbophil (FIBER-CAPS PO) Take by mouth daily.    . cyclobenzaprine (FLEXERIL) 5 MG tablet Take 1 tablet (5 mg total) by mouth at bedtime. 10 tablet 0  . hydrocortisone (ANUSOL-HC) 25 MG suppository Place 1 suppository (25 mg total) rectally 2 (two) times daily. 14 suppository 0  . meloxicam (MOBIC) 7.5 MG tablet 1-2 tab po q day 30 tablet 0  . metFORMIN (GLUCOPHAGE) 500 MG tablet TAKE 2 TABLETS TWICE DAILY 360 tablet 0  . omeprazole (PRILOSEC) 20 MG capsule Take 20 mg by mouth daily.    . saxagliptin HCl (ONGLYZA) 5 MG TABS tablet Take 1 tablet (5 mg total) by mouth daily. 30 tablet 3  . simvastatin (ZOCOR) 40 MG tablet TAKE 1 TABLET AT BEDTIME 90 tablet 3  . traMADol (ULTRAM) 50 MG tablet Take 1 tablet (50 mg total) by mouth every 8 (eight) hours as needed. 15 tablet 0  . TRUE METRIX BLOOD GLUCOSE TEST test strip CHECK BLOOD SUGAR ONCE TO TWICE DAILY 100 each 2  . TRUEPLUS LANCETS 33G MISC CHECK BLOOD  SUGAR  1  TO  2  TIMES  A  DAY 100 each 0   Current Facility-Administered Medications on File Prior to Visit  Medication Dose Route Frequency Provider Last Rate Last Dose  . 0.9 %  sodium chloride infusion  500 mL Intravenous Continuous Irene Shipper, MD        Past Surgical History:  Procedure Laterality Date  . APPENDECTOMY  1971  . laceration hand Left 1975  . LUNG BIOPSY  1980    No Known Allergies  Social History   Socioeconomic History  . Marital status: Married    Spouse name: Not on file  . Number of children: Not on file  . Years of education: Not on file  . Highest education level: Not on file  Occupational History  . Not on file  Social Needs  . Financial resource strain: Not on file  . Food insecurity:    Worry: Not on file    Inability: Not on file  . Transportation needs:    Medical: Not on file    Non-medical: Not on file  Tobacco Use  . Smoking status: Never Smoker  . Smokeless tobacco: Never Used  Substance and Sexual Activity  . Alcohol use: No  . Drug use: No  .  Sexual activity: Yes  Lifestyle  . Physical activity:    Days per week: Not on file    Minutes per session: Not on file  . Stress: Not on file  Relationships  . Social connections:    Talks on phone: Not on file    Gets together: Not on file    Attends religious service: Not on file    Active member of club or organization: Not on file    Attends meetings of clubs or organizations: Not on file    Relationship status: Not on file  . Intimate partner violence:    Fear of current or ex partner: Not on file    Emotionally abused: Not on file    Physically abused: Not on file    Forced sexual activity: Not on file  Other Topics Concern  . Not on file  Social History Narrative  . Not on file    Family History  Problem Relation Age of Onset  . Hypertension Mother   . Hypertension Father   . Prostate cancer Brother   . Colon cancer Neg Hx   . Esophageal cancer Neg Hx   .  Stomach cancer Neg Hx   . Rectal cancer Neg Hx   . Pancreatic cancer Neg Hx     BP 131/81   Pulse 79   Ht '5\' 9"'  (1.753 m)   Wt 204 lb (92.5 kg)   BMI 30.13 kg/m   Review of Systems: See HPI above.     Objective:  Physical Exam:  Gen: NAD, comfortable in exam room  Back: No gross deformity, scoliosis. No paraspinal TTP .  No midline or bony TTP. FROM with mild tightness of hamstrings. Strength LEs 5/5 all muscle groups.   Trace MSRs in patellar and 1+ achilles tendons, equal bilaterally. Negative SLRs. Sensation intact to light touch bilaterally.  Left hip: No deformity. FROM with 5/5 strength. No tenderness to palpation. NVI distally. Negative logroll bilateral hips Negative fabers and piriformis stretches.   Assessment & Plan:  1. Low back pain radiating into left leg - consistent with lumbar radiculopathy.  Independently reviewed hip radiographs and no abnormalities.  Reviewed lumbar spine radiographs too and noted arthropathy especially at L4-5 but mild-moderate.  Start prednisone dose pack with physical therapy, home exercise program.  F/u in 6 weeks.  Consider MRI if not improving as expected.

## 2018-01-23 NOTE — Patient Instructions (Signed)
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 Take tylenol for baseline pain relief (1-2 extra strength tabs 3x/day) if needed Take prednisone dose pack as directed. Stay as active as possible. Physical therapy has been shown to be helpful as well - start this and do home exercises on days you don't go to therapy. Strengthening of low back muscles, abdominal musculature are key for long term pain relief. If not improving, will consider further imaging (MRI). Follow up with me in 6 weeks.

## 2018-02-02 ENCOUNTER — Ambulatory Visit (INDEPENDENT_AMBULATORY_CARE_PROVIDER_SITE_OTHER): Payer: BLUE CROSS/BLUE SHIELD | Admitting: Medical

## 2018-02-02 ENCOUNTER — Encounter: Payer: Self-pay | Admitting: Medical

## 2018-02-02 VITALS — BP 140/90 | HR 80 | Temp 98.2°F | Resp 16 | Ht 69.0 in | Wt 208.8 lb

## 2018-02-02 DIAGNOSIS — I10 Essential (primary) hypertension: Secondary | ICD-10-CM

## 2018-02-02 DIAGNOSIS — E785 Hyperlipidemia, unspecified: Secondary | ICD-10-CM | POA: Diagnosis not present

## 2018-02-02 DIAGNOSIS — E119 Type 2 diabetes mellitus without complications: Secondary | ICD-10-CM | POA: Diagnosis not present

## 2018-02-02 LAB — COMPREHENSIVE METABOLIC PANEL
ALT: 22 U/L (ref 0–53)
AST: 19 U/L (ref 0–37)
Albumin: 4.2 g/dL (ref 3.5–5.2)
Alkaline Phosphatase: 58 U/L (ref 39–117)
BUN: 9 mg/dL (ref 6–23)
CO2: 25 mEq/L (ref 19–32)
Calcium: 9 mg/dL (ref 8.4–10.5)
Chloride: 103 mEq/L (ref 96–112)
Creatinine, Ser: 0.97 mg/dL (ref 0.40–1.50)
GFR: 98.79 mL/min (ref >60.00)
Glucose, Bld: 233 mg/dL — ABNORMAL HIGH (ref 70–99)
Potassium: 3.7 mEq/L (ref 3.5–5.1)
Sodium: 138 mEq/L (ref 135–145)
Total Bilirubin: 0.4 mg/dL (ref 0.2–1.2)
Total Protein: 6.6 g/dL (ref 6.0–8.3)

## 2018-02-02 LAB — LIPID PANEL
Cholesterol: 160 mg/dL (ref 0–200)
HDL: 50.7 mg/dL (ref >39.00)
LDL Cholesterol: 89 mg/dL (ref 0–99)
NonHDL: 109.7
Total CHOL/HDL Ratio: 3
Triglycerides: 105 mg/dL (ref 0.0–149.0)
VLDL: 21 mg/dL (ref 0.0–40.0)

## 2018-02-02 LAB — HEMOGLOBIN A1C: Hgb A1c MFr Bld: 6.5 % (ref 4.6–6.5)

## 2018-02-02 MED ORDER — BLOOD GLUCOSE METER KIT: PACK | 0 refills | Status: AC

## 2018-02-02 NOTE — Progress Notes (Signed)
Subjective:    Patient ID: Russell Shannon, male    DOB: 1950-12-29, 67 y.o.   MRN: [id number]  HPI  Pt in for follow up.  Pt has htn, diabetes and gout.    Pt mild  bp high initially. No gross motor or sensory function deficits. Pt bp was 135/68 last night at home.  Pt has gout. No recent flairs.  Pt diabetes last checked in sept.   Pt is fasting today. Pt is on zocor.    Review of Systems  Constitutional: Negative for chills, fatigue and fever.  Respiratory: Negative for cough, chest tightness, shortness of breath and wheezing.   Cardiovascular: Negative for chest pain and palpitations.  Gastrointestinal: Negative for abdominal pain, diarrhea and rectal pain.       He states sometimes loose stools. Couple of times a month will get upset stomach. He thinks maybe metformin.  Musculoskeletal: Negative for back pain.  Neurological: Negative for dizziness, syncope, weakness and headaches.  Hematological: Negative for adenopathy. Does not bruise/bleed easily.  Psychiatric/Behavioral: Negative for behavioral problems and confusion.   Past Medical History:  Diagnosis Date  . Arthritis    hips  . Diabetes mellitus   . Gout   . Hyperlipidemia   . Hypertension      Social History   Socioeconomic History  . Marital status: Married    Spouse name: Not on file  . Number of children: Not on file  . Years of education: Not on file  . Highest education level: Not on file  Occupational History  . Not on file  Social Needs  . Financial resource strain: Not on file  . Food insecurity:    Worry: Not on file    Inability: Not on file  . Transportation needs:    Medical: Not on file    Non-medical: Not on file  Tobacco Use  . Smoking status: Never Smoker  . Smokeless tobacco: Never Used  Substance and Sexual Activity  . Alcohol use: No  . Drug use: No  . Sexual activity: Yes  Lifestyle  . Physical activity:    Days per week: Not on file    Minutes per session: Not  on file  . Stress: Not on file  Relationships  . Social connections:    Talks on phone: Not on file    Gets together: Not on file    Attends religious service: Not on file    Active member of club or organization: Not on file    Attends meetings of clubs or organizations: Not on file    Relationship status: Not on file  . Intimate partner violence:    Fear of current or ex partner: Not on file    Emotionally abused: Not on file    Physically abused: Not on file    Forced sexual activity: Not on file  Other Topics Concern  . Not on file  Social History Narrative  . Not on file    Past Surgical History:  Procedure Laterality Date  . APPENDECTOMY  1971  . laceration hand Left 1975  . LUNG BIOPSY  1980    Family History  Problem Relation Age of Onset  . Hypertension Mother   . Hypertension Father   . Prostate cancer Brother   . Colon cancer Neg Hx   . Esophageal cancer Neg Hx   . Stomach cancer Neg Hx   . Rectal cancer Neg Hx   . Pancreatic cancer Neg Hx  No Known Allergies  Current Outpatient Medications on File Prior to Visit  Medication Sig Dispense Refill  . allopurinol (ZYLOPRIM) 300 MG tablet Take 1 tablet (300 mg total) by mouth daily. 30 tablet 6  . amLODipine (NORVASC) 10 MG tablet TAKE 1 TABLET EVERY DAY 90 tablet 1  . aspirin EC 81 MG tablet Take 81 mg by mouth daily.    . blood glucose meter kit and supplies Check Blood Sugar 1-2 times a day. 1 each 0  . Calcium Polycarbophil (FIBER-CAPS PO) Take by mouth daily.    . cyclobenzaprine (FLEXERIL) 5 MG tablet Take 1 tablet (5 mg total) by mouth at bedtime. 10 tablet 0  . hydrocortisone (ANUSOL-HC) 25 MG suppository Place 1 suppository (25 mg total) rectally 2 (two) times daily. 14 suppository 0  . meloxicam (MOBIC) 7.5 MG tablet 1-2 tab po q day 30 tablet 0  . metFORMIN (GLUCOPHAGE) 500 MG tablet TAKE 2 TABLETS TWICE DAILY 360 tablet 0  . omeprazole (PRILOSEC) 20 MG capsule Take 20 mg by mouth daily.    .  predniSONE (DELTASONE) 10 MG tablet 6 tabs po day 1, 5 tabs po day 2, 4 tabs po day 3, 3 tabs po day 4, 2 tabs po day 5, 1 tab po day 6 21 tablet 0  . saxagliptin HCl (ONGLYZA) 5 MG TABS tablet Take 1 tablet (5 mg total) by mouth daily. 30 tablet 3  . simvastatin (ZOCOR) 40 MG tablet TAKE 1 TABLET AT BEDTIME 90 tablet 3  . traMADol (ULTRAM) 50 MG tablet Take 1 tablet (50 mg total) by mouth every 8 (eight) hours as needed. 15 tablet 0  . TRUE METRIX BLOOD GLUCOSE TEST test strip CHECK BLOOD SUGAR ONCE TO TWICE DAILY 100 each 2  . TRUEPLUS LANCETS 33G MISC CHECK BLOOD SUGAR  1  TO  2  TIMES  A  DAY 100 each 0   Current Facility-Administered Medications on File Prior to Visit  Medication Dose Route Frequency Provider Last Rate Last Dose  . 0.9 %  sodium chloride infusion  500 mL Intravenous Continuous Irene Shipper, MD        BP (!) 145/73   Pulse 80   Temp 98.2 F (36.8 C) (Oral)   Resp 16   Ht _0  (1.753 m)   Wt 208 lb 12.8 oz (94.7 kg)   SpO2 99%   BMI 30.83 kg/m      Objective:   Physical Exam  General Mental Status- Alert. General Appearance- Not in acute distress.   Skin General: Color- Normal Color. Moisture- Normal Moisture.  Neck Carotid Arteries- Normal color. Moisture- Normal Moisture. No carotid bruits. No JVD.  Chest and Lung Exam Auscultation: Breath Sounds:-Normal.  Cardiovascular Auscultation:Rythm- Regular. Murmurs & Other Heart Sounds:Auscultation of the heart reveals- No Murmurs.  Abdomen Inspection:-Inspeection Normal. Palpation/Percussion:Note:No mass. Palpation and Percussion of the abdomen reveal- Non Tender, Non Distended + BS, no rebound or guarding.   Neurologic Cranial Nerve exam:- CN III-XII intact(No nystagmus), symmetric smile. Strength:- 5/5 equal and symmetric strength both upper and lower extremities.      Assessment & Plan:  Your blood pressure is better at home and borderline today at the office.  With hypertension would prefer  that your blood pressure be closer to 130/80.  Keep checking your blood pressure daily and if your blood pressure not close to 130/80 will add additional BP medication.  For diabetes, will get A1c today.  Also check urine microalbumin.  For high  cholesterol, will get lipid panel today.  Uric acid in the past showed level of 6.0 no recent gout flares.  Continue allopurinol.  We will get metabolic check kidney function today.  Follow-up date to be determined after lab review.  Probably in 3 months or as needed.

## 2018-02-02 NOTE — Patient Instructions (Signed)
Your blood pressure is better at home and borderline today at the office.  With hypertension would prefer that your blood pressure be closer to 130/80.  Keep checking your blood pressure daily and if your blood pressure not close to 130/80 will add additional BP medication.  For diabetes, will get A1c today.  Also check urine microalbumin.  For high cholesterol, will get lipid panel today.  Uric acid in the past showed level of 6.0 no recent gout flares.  Continue allopurinol.  We will get metabolic check kidney function today.  Follow-up date to be determined after lab review.  Probably in 3 months or as needed.

## 2018-02-03 LAB — MICROALBUMIN, URINE: Microalb, Ur: 3.5 mg/dL

## 2018-02-26 ENCOUNTER — Other Ambulatory Visit: Payer: Self-pay

## 2018-02-26 MED ORDER — METFORMIN HCL 500 MG PO TABS: 1000.0000 mg | ORAL_TABLET | Freq: Two times a day (BID) | ORAL | 0 refills | Status: DC

## 2018-03-06 ENCOUNTER — Encounter: Payer: Self-pay | Admitting: Family Medicine

## 2018-03-06 ENCOUNTER — Ambulatory Visit (INDEPENDENT_AMBULATORY_CARE_PROVIDER_SITE_OTHER): Payer: BLUE CROSS/BLUE SHIELD | Admitting: Family Medicine

## 2018-03-06 VITALS — BP 141/94 | HR 100 | Ht 69.0 in | Wt 204.0 lb

## 2018-03-06 DIAGNOSIS — M79605 Pain in left leg: Secondary | ICD-10-CM

## 2018-03-06 DIAGNOSIS — M545 Low back pain: Secondary | ICD-10-CM

## 2018-03-06 MED ORDER — PREDNISONE 10 MG PO TABS: ORAL_TABLET | ORAL | 0 refills | Status: DC

## 2018-03-06 MED ORDER — TRAMADOL HCL 50 MG PO TABS: 50.0000 mg | ORAL_TABLET | Freq: Four times a day (QID) | ORAL | 0 refills | Status: DC | PRN

## 2018-03-06 NOTE — Patient Instructions (Signed)
We will go ahead with an MRI of your lumbar spine. Take extended prednisone dose pack as directed. Tramadol as needed for severe pain. I will contact you with the MRI results and next steps.

## 2018-03-07 ENCOUNTER — Encounter: Payer: Self-pay | Admitting: Family Medicine

## 2018-03-07 NOTE — Progress Notes (Signed)
PCP: Mackie Pai, PA-C  Subjective:   HPI: Patient is a 68 y.o. male here for left back/hip pain.  12/6: Patient reports for about 1 year he's had pain posterior aspect of left low back and hip radiating down left leg. Associated with numbness and tingling as well past the knee. Pain level is 2-3/10 but up to 10/10 and sharp at worst. Tried flexeril without much benefit. Has not tried physical therapy. No skin changes. No bowel/bladder dysfunction.  03/06/18: Patient reports he's continued to struggle with pain up to 10/10 level in left side of low back down left leg to the foot, sharp. Associated tingling. Has been doing physical therapy. Feels prednisone is the only thing that helped his pain. Cannot lie on left side. No bowel/bladder dysfunction.  Past Medical History:  Diagnosis Date  . Arthritis    hips  . Diabetes mellitus   . Gout   . Hyperlipidemia   . Hypertension     Current Outpatient Medications on File Prior to Visit  Medication Sig Dispense Refill  . allopurinol (ZYLOPRIM) 300 MG tablet Take 1 tablet (300 mg total) by mouth daily. 30 tablet 6  . amLODipine (NORVASC) 10 MG tablet TAKE 1 TABLET EVERY DAY 90 tablet 1  . aspirin EC 81 MG tablet Take 81 mg by mouth daily.    . blood glucose meter kit and supplies Check Blood Sugar 1-2 times a day. 1 each 0  . blood glucose meter kit and supplies Check blood sugar twice a day ( E11.9). 1 each 0  . Calcium Polycarbophil (FIBER-CAPS PO) Take by mouth daily.    . cyclobenzaprine (FLEXERIL) 5 MG tablet Take 1 tablet (5 mg total) by mouth at bedtime. 10 tablet 0  . hydrocortisone (ANUSOL-HC) 25 MG suppository Place 1 suppository (25 mg total) rectally 2 (two) times daily. 14 suppository 0  . meloxicam (MOBIC) 7.5 MG tablet 1-2 tab po q day 30 tablet 0  . metFORMIN (GLUCOPHAGE) 500 MG tablet Take 2 tablets (1,000 mg total) by mouth 2 (two) times daily. 360 tablet 0  . omeprazole (PRILOSEC) 20 MG capsule Take 20 mg  by mouth daily.    . saxagliptin HCl (ONGLYZA) 5 MG TABS tablet Take 1 tablet (5 mg total) by mouth daily. 30 tablet 3  . simvastatin (ZOCOR) 40 MG tablet TAKE 1 TABLET AT BEDTIME 90 tablet 3  . TRUE METRIX BLOOD GLUCOSE TEST test strip CHECK BLOOD SUGAR ONCE TO TWICE DAILY 100 each 2  . TRUEPLUS LANCETS 33G MISC CHECK BLOOD SUGAR  1  TO  2  TIMES  A  DAY 100 each 0   Current Facility-Administered Medications on File Prior to Visit  Medication Dose Route Frequency Provider Last Rate Last Dose  . 0.9 %  sodium chloride infusion  500 mL Intravenous Continuous Irene Shipper, MD        Past Surgical History:  Procedure Laterality Date  . APPENDECTOMY  1971  . laceration hand Left 1975  . LUNG BIOPSY  1980    No Known Allergies  Social History   Socioeconomic History  . Marital status: Married    Spouse name: Not on file  . Number of children: Not on file  . Years of education: Not on file  . Highest education level: Not on file  Occupational History  . Not on file  Social Needs  . Financial resource strain: Not on file  . Food insecurity:    Worry: Not on file  Inability: Not on file  . Transportation needs:    Medical: Not on file    Non-medical: Not on file  Tobacco Use  . Smoking status: Never Smoker  . Smokeless tobacco: Never Used  Substance and Sexual Activity  . Alcohol use: No  . Drug use: No  . Sexual activity: Yes  Lifestyle  . Physical activity:    Days per week: Not on file    Minutes per session: Not on file  . Stress: Not on file  Relationships  . Social connections:    Talks on phone: Not on file    Gets together: Not on file    Attends religious service: Not on file    Active member of club or organization: Not on file    Attends meetings of clubs or organizations: Not on file    Relationship status: Not on file  . Intimate partner violence:    Fear of current or ex partner: Not on file    Emotionally abused: Not on file    Physically abused:  Not on file    Forced sexual activity: Not on file  Other Topics Concern  . Not on file  Social History Narrative  . Not on file    Family History  Problem Relation Age of Onset  . Hypertension Mother   . Hypertension Father   . Prostate cancer Brother   . Colon cancer Neg Hx   . Esophageal cancer Neg Hx   . Stomach cancer Neg Hx   . Rectal cancer Neg Hx   . Pancreatic cancer Neg Hx     BP (!) 141/94   Pulse 100   Ht '5\' 9"'  (1.753 m)   Wt 204 lb (92.5 kg)   BMI 30.13 kg/m   Review of Systems: See HPI above.     Objective:  Physical Exam:  Gen: NAD, comfortable in exam room  Back: No gross deformity, scoliosis. No paraspinal TTP .  No midline or bony TTP. FROM with mild pain on flexion. Strength LEs 5/5 all muscle groups.   Trace MSRs in patellar and achilles tendons, equal bilaterally. Negative SLRs. Sensation intact to light touch bilaterally.  Left hip: No deformity. FROM with 5/5 strength. No tenderness to palpation. NVI distally. Negative logroll bilateral hips   Assessment & Plan:  1. Low back pain radiating into left leg - consistent with lumbar radiculopathy.  Not improving with conservative measures over past 6 weeks, only transient benefit with prednisone.  Will go ahead with MRI.  Extended course of prednisone with tramadol as needed.  Next steps, f/u will depend on MRI results.

## 2018-03-16 ENCOUNTER — Other Ambulatory Visit: Payer: Self-pay | Admitting: Medical

## 2018-03-19 ENCOUNTER — Other Ambulatory Visit: Payer: Self-pay

## 2018-03-19 MED ORDER — AMLODIPINE BESYLATE 10 MG PO TABS: 10.0000 mg | ORAL_TABLET | Freq: Every day | ORAL | 1 refills | Status: DC

## 2018-03-19 MED ORDER — METFORMIN HCL 500 MG PO TABS: 1000.0000 mg | ORAL_TABLET | Freq: Two times a day (BID) | ORAL | 0 refills | Status: DC

## 2018-03-23 ENCOUNTER — Telehealth: Payer: Self-pay | Admitting: Medical

## 2018-03-23 NOTE — Telephone Encounter (Signed)
Copied from CRM [id number]. Topic: Quick Communication - Rx Refill/Question >> Mar 23, 2018  4:18 PM Jaquita Rector A wrote: Medication: amLODipine (NORVASC) 10 MG tablet, metFORMIN (GLUCOPHAGE) 500 MG tablet, ONGLYZA 5 MG TABS tablet,  Accu-Chek test strips, Accu-Chek lancets  Has the patient contacted their pharmacy? Yes.   (Agent: If no, request that the patient contact the pharmacy for the refill.) (Agent: If yes, when and what did the pharmacy advise?)  Preferred Pharmacy (with phone number or street name): [facility] - [place], [place] - [address] [phone] (Phone) [phone] (Fax)    Agent: Please be advised that RX refills may take up to 3 business days. We ask that you follow-up with your pharmacy.

## 2018-03-24 MED ORDER — METFORMIN HCL 500 MG PO TABS: 1000.0000 mg | ORAL_TABLET | Freq: Two times a day (BID) | ORAL | 0 refills | Status: DC

## 2018-03-24 MED ORDER — AMLODIPINE BESYLATE 10 MG PO TABS: 10.0000 mg | ORAL_TABLET | Freq: Every day | ORAL | 1 refills | Status: DC

## 2018-03-24 MED ORDER — TRUEPLUS LANCETS 33G MISC: 0 refills | Status: DC

## 2018-03-24 MED ORDER — GLUCOSE BLOOD VI STRP: ORAL_STRIP | 2 refills | Status: DC

## 2018-03-24 MED ORDER — SAXAGLIPTIN HCL 5 MG PO TABS: 5.0000 mg | ORAL_TABLET | Freq: Every day | ORAL | 3 refills | Status: DC

## 2018-03-25 ENCOUNTER — Telehealth: Payer: Self-pay

## 2018-03-25 DIAGNOSIS — Z0279 Encounter for issue of other medical certificate: Secondary | ICD-10-CM

## 2018-03-25 MED ORDER — SITAGLIPTIN PHOSPHATE 25 MG PO TABS: 25.0000 mg | ORAL_TABLET | Freq: Every day | ORAL | 3 refills | Status: DC

## 2018-03-25 NOTE — Telephone Encounter (Signed)
PA initiated via Covermymeds; KEY: [id number]. Awaiting determination.

## 2018-03-25 NOTE — Telephone Encounter (Signed)
Pt calling stating that he gave the nurse the wrong information on his meter he uses the One Touch meter please call pt if you have any questions [phone]

## 2018-03-25 NOTE — Telephone Encounter (Signed)
Rx [place] sent to pt pharmacy in place of onglyza.

## 2018-03-25 NOTE — Telephone Encounter (Signed)
Onglyza not covered, preferred alternatives: Januvia, and Tradjenta. Did not see a contraindication for either of these. Please advise.

## 2018-04-01 ENCOUNTER — Ambulatory Visit
Admission: RE | Admit: 2018-04-01 | Discharge: 2018-04-01 | Disposition: A | Discharge status: Home / Self Care | Payer: BLUE CROSS/BLUE SHIELD | Source: Ambulatory Visit | Attending: Family Medicine | Admitting: Family Medicine

## 2018-04-01 DIAGNOSIS — M545 Low back pain, unspecified: Secondary | ICD-10-CM

## 2018-04-01 DIAGNOSIS — M79605 Pain in left leg: Secondary | ICD-10-CM

## 2018-04-09 ENCOUNTER — Other Ambulatory Visit: Payer: Self-pay | Admitting: Family Medicine

## 2018-04-09 DIAGNOSIS — M5416 Radiculopathy, lumbar region: Secondary | ICD-10-CM

## 2018-04-10 ENCOUNTER — Encounter: Payer: Self-pay | Admitting: Medical

## 2018-04-10 ENCOUNTER — Ambulatory Visit (INDEPENDENT_AMBULATORY_CARE_PROVIDER_SITE_OTHER): Payer: BLUE CROSS/BLUE SHIELD | Admitting: Medical

## 2018-04-10 ENCOUNTER — Telehealth: Payer: Self-pay | Admitting: Medical

## 2018-04-10 ENCOUNTER — Ambulatory Visit: Payer: BLUE CROSS/BLUE SHIELD | Admitting: Medical

## 2018-04-10 VITALS — BP 135/75 | HR 78 | Temp 97.8°F | Resp 16 | Ht 69.0 in | Wt 203.0 lb

## 2018-04-10 DIAGNOSIS — R21 Rash and other nonspecific skin eruption: Secondary | ICD-10-CM | POA: Diagnosis not present

## 2018-04-10 DIAGNOSIS — E785 Hyperlipidemia, unspecified: Secondary | ICD-10-CM

## 2018-04-10 DIAGNOSIS — I10 Essential (primary) hypertension: Secondary | ICD-10-CM

## 2018-04-10 DIAGNOSIS — E119 Type 2 diabetes mellitus without complications: Secondary | ICD-10-CM

## 2018-04-10 DIAGNOSIS — M25552 Pain in left hip: Secondary | ICD-10-CM

## 2018-04-10 MED ORDER — NYSTATIN-TRIAMCINOLONE 100000-0.1 UNIT/GM-% EX OINT: 1.0000 "application " | TOPICAL_OINTMENT | Freq: Two times a day (BID) | CUTANEOUS | 1 refills | Status: DC

## 2018-04-10 MED ORDER — ONETOUCH ULTRASOFT LANCETS MISC: 12 refills | Status: DC

## 2018-04-10 NOTE — Patient Instructions (Signed)
You do appear to have fungal infection to groin regions bilaterally.  Will give might nystatin-triamcinolone combination cream to apply twice daily.  Make sure you keep groin area as dry as possible.  Providing a 1 to refill as well.  For diabetes, continue current medication regimen.  I did ask medical assistant to send then strips and lancets supplies to continue to check your blood pressure twice daily.  Note try to keep tight control of your sugars as sensitized high sugars can allow environment for fungus to grow easily.  Your blood pressure was better on recheck today.  Continue current BP medication regimen.  History of high cholesterol and continue current statin medication.  Hopefully you will be able to get scheduled with specialist for hip injection sometime early next week.  Follow-up end of March with me early in a.m.  Come in fasting so he can get labs done.  Follow-up sooner as if needed.

## 2018-04-10 NOTE — Progress Notes (Signed)
Subjective:    Patient ID: Russell Shannon, male    DOB: 1950-10-31, 68 y.o.   MRN: [id number]  HPI  Pt in for rash between his legs. Pt has rash in both upper thighs and groin areas. It does itch a lot. Pt sweats a lot at work. He works sometimes 10-14 hours days.  Pt needs supplies for his one touch glucometer. He is checking morning fasting and after on time after meal daily. Last a1c was 6.5.  Pt has left hip pain. He has upcoming injection with specialist. May have injection done next week.  Bp is little high today initially. Rechecked and improved.  Pt lipid panel has been controlled 2 months.     Review of Systems  Constitutional: Negative for chills, fatigue and fever.  Respiratory: Negative for cough, chest tightness, shortness of breath and wheezing.   Cardiovascular: Negative for chest pain and palpitations.  Gastrointestinal: Negative for abdominal pain.  Genitourinary: Negative for decreased urine volume, dysuria, flank pain, frequency, hematuria, penile swelling and testicular pain.  Musculoskeletal: Negative for back pain.  Skin: Positive for rash.  Neurological: Negative for dizziness, seizures, speech difficulty, weakness, light-headedness and headaches.  Hematological: Negative for adenopathy. Does not bruise/bleed easily.  Psychiatric/Behavioral: Negative for behavioral problems, confusion and sleep disturbance. The patient is not nervous/anxious.     Past Medical History:  Diagnosis Date  . Arthritis    hips  . Diabetes mellitus   . Gout   . Hyperlipidemia   . Hypertension      Social History   Socioeconomic History  . Marital status: Married    Spouse name: Not on file  . Number of children: Not on file  . Years of education: Not on file  . Highest education level: Not on file  Occupational History  . Not on file  Social Needs  . Financial resource strain: Not on file  . Food insecurity:    Worry: Not on file    Inability: Not on file  .  Transportation needs:    Medical: Not on file    Non-medical: Not on file  Tobacco Use  . Smoking status: Never Smoker  . Smokeless tobacco: Never Used  Substance and Sexual Activity  . Alcohol use: No  . Drug use: No  . Sexual activity: Yes  Lifestyle  . Physical activity:    Days per week: Not on file    Minutes per session: Not on file  . Stress: Not on file  Relationships  . Social connections:    Talks on phone: Not on file    Gets together: Not on file    Attends religious service: Not on file    Active member of club or organization: Not on file    Attends meetings of clubs or organizations: Not on file    Relationship status: Not on file  . Intimate partner violence:    Fear of current or ex partner: Not on file    Emotionally abused: Not on file    Physically abused: Not on file    Forced sexual activity: Not on file  Other Topics Concern  . Not on file  Social History Narrative  . Not on file    Past Surgical History:  Procedure Laterality Date  . APPENDECTOMY  1971  . laceration hand Left 1975  . LUNG BIOPSY  1980    Family History  Problem Relation Age of Onset  . Hypertension Mother   . Hypertension Father   .  Prostate cancer Brother   . Colon cancer Neg Hx   . Esophageal cancer Neg Hx   . Stomach cancer Neg Hx   . Rectal cancer Neg Hx   . Pancreatic cancer Neg Hx     No Known Allergies  Current Outpatient Medications on File Prior to Visit  Medication Sig Dispense Refill  . allopurinol (ZYLOPRIM) 300 MG tablet Take 1 tablet (300 mg total) by mouth daily. 30 tablet 6  . amLODipine (NORVASC) 10 MG tablet Take 1 tablet (10 mg total) by mouth daily. 90 tablet 1  . aspirin EC 81 MG tablet Take 81 mg by mouth daily.    . blood glucose meter kit and supplies Check Blood Sugar 1-2 times a day. 1 each 0  . blood glucose meter kit and supplies Check blood sugar twice a day ( E11.9). 1 each 0  . Calcium Polycarbophil (FIBER-CAPS PO) Take by mouth  daily.    . cyclobenzaprine (FLEXERIL) 5 MG tablet Take 1 tablet (5 mg total) by mouth at bedtime. 10 tablet 0  . glucose blood (TRUE METRIX BLOOD GLUCOSE TEST) test strip CHECK BLOOD SUGAR ONCE TO TWICE DAILY 100 each 2  . hydrocortisone (ANUSOL-HC) 25 MG suppository Place 1 suppository (25 mg total) rectally 2 (two) times daily. 14 suppository 0  . meloxicam (MOBIC) 7.5 MG tablet 1-2 tab po q day 30 tablet 0  . metFORMIN (GLUCOPHAGE) 500 MG tablet Take 2 tablets (1,000 mg total) by mouth 2 (two) times daily. 360 tablet 0  . omeprazole (PRILOSEC) 20 MG capsule Take 20 mg by mouth daily.    . predniSONE (DELTASONE) 10 MG tablet 6 tabs po days 1-2, 5 tabs po days 3-4, 4 tabs po days 5-6, 3 tabs po days 7-8, 2 tabs po days 9-10, 1 tab po days 11-12 42 tablet 0  . simvastatin (ZOCOR) 40 MG tablet TAKE 1 TABLET AT BEDTIME 90 tablet 3  . sitaGLIPtin (JANUVIA) 25 MG tablet Take 1 tablet (25 mg total) by mouth daily. 30 tablet 3  . traMADol (ULTRAM) 50 MG tablet Take 1 tablet (50 mg total) by mouth every 6 (six) hours as needed. 20 tablet 0  . TRUEPLUS LANCETS 33G MISC CHECK BLOOD SUGAR  1  TO  2  TIMES  A  DAY 100 each 0   Current Facility-Administered Medications on File Prior to Visit  Medication Dose Route Frequency Provider Last Rate Last Dose  . 0.9 %  sodium chloride infusion  500 mL Intravenous Continuous Irene Shipper, MD        BP (!) 144/76   Pulse 78   Temp 97.8 F (36.6 C) (Oral)   Resp 16   Ht '5\' 9"'$  (1.753 m)   Wt 203 lb (92.1 kg)   SpO2 99%   BMI 29.98 kg/m       Objective:   Physical Exam  General Mental Status- Alert. General Appearance- Not in acute distress.   Skin Groin area hyperpigmented mild red rash bilaterally  Neck Carotid Arteries- Normal color. Moisture- Normal Moisture. No carotid bruits. No JVD.  Chest and Lung Exam Auscultation: Breath Sounds:-Normal.  Cardiovascular Auscultation:Rythm- Regular. Murmurs & Other Heart Sounds:Auscultation of the  heart reveals- No Murmurs.  Abdomen Inspection:-Inspeection Normal. Palpation/Percussion:Note:No mass. Palpation and Percussion of the abdomen reveal- Non Tender, Non Distended + BS, no rebound or guarding.   Neurologic Cranial Nerve exam:- CN III-XII intact(No nystagmus), symmetric smile. Strength:- 5/5 equal and symmetric strength both upper and lower extremities.  Assessment & Plan:  You do appear to have fungal infection to groin regions bilaterally.  Will give might nystatin-triamcinolone combination cream to apply twice daily.  Make sure you keep groin area as dry as possible.  Providing a 1 to refill as well.  For diabetes, continue current medication regimen.  I did ask medical assistant to send then strips and lancets supplies to continue to check your blood pressure twice daily.  Note try to keep tight control of your sugars as sensitized high sugars can allow environment for fungus to grow easily.  Your blood pressure was better on recheck today.  Continue current BP medication regimen.  History of high cholesterol and continue current statin medication.  Hopefully you will be able to get scheduled with specialist for hip injection sometime early next week.  Follow-up end of March with me early in a.m.  Come in fasting so he can get labs done.  Follow-up sooner as if needed.  Mackie Pai, PA-C

## 2018-04-10 NOTE — Telephone Encounter (Signed)
Done

## 2018-04-10 NOTE — Telephone Encounter (Signed)
Will you send in rx for lancets and strips. Pt checks sugar twice daily.  He uses one touch.

## 2018-04-15 ENCOUNTER — Ambulatory Visit
Admission: RE | Admit: 2018-04-15 | Discharge: 2018-04-15 | Disposition: A | Discharge status: Home / Self Care | Payer: BLUE CROSS/BLUE SHIELD | Source: Ambulatory Visit | Attending: Family Medicine | Admitting: Family Medicine

## 2018-04-15 DIAGNOSIS — M5416 Radiculopathy, lumbar region: Secondary | ICD-10-CM

## 2018-04-15 MED ORDER — IOPAMIDOL (ISOVUE-M 200) INJECTION 41%
1.0000 mL | Freq: Once | INTRAMUSCULAR | Status: AC
  Administered 2018-04-15: 1 mL via EPIDURAL

## 2018-04-15 MED ORDER — METHYLPREDNISOLONE ACETATE 40 MG/ML INJ SUSP (RADIOLOG
120.0000 mg | Freq: Once | INTRAMUSCULAR | Status: AC
  Administered 2018-04-15: 120 mg via EPIDURAL

## 2018-04-15 NOTE — Discharge Instructions (Signed)

## 2018-05-04 ENCOUNTER — Ambulatory Visit: Payer: BLUE CROSS/BLUE SHIELD | Admitting: Medical

## 2018-05-08 ENCOUNTER — Encounter: Payer: Self-pay | Admitting: Medical

## 2018-05-08 ENCOUNTER — Ambulatory Visit (INDEPENDENT_AMBULATORY_CARE_PROVIDER_SITE_OTHER): Payer: BLUE CROSS/BLUE SHIELD | Admitting: Medical

## 2018-05-08 ENCOUNTER — Other Ambulatory Visit: Payer: Self-pay

## 2018-05-08 VITALS — BP 131/79 | HR 72 | Temp 97.8°F | Resp 16 | Ht 69.0 in | Wt 196.0 lb

## 2018-05-08 DIAGNOSIS — I10 Essential (primary) hypertension: Secondary | ICD-10-CM | POA: Diagnosis not present

## 2018-05-08 DIAGNOSIS — R21 Rash and other nonspecific skin eruption: Secondary | ICD-10-CM | POA: Diagnosis not present

## 2018-05-08 DIAGNOSIS — K0889 Other specified disorders of teeth and supporting structures: Secondary | ICD-10-CM | POA: Diagnosis not present

## 2018-05-08 DIAGNOSIS — E119 Type 2 diabetes mellitus without complications: Secondary | ICD-10-CM | POA: Diagnosis not present

## 2018-05-08 DIAGNOSIS — R0789 Other chest pain: Secondary | ICD-10-CM | POA: Diagnosis not present

## 2018-05-08 LAB — TROPONIN I: TNIDX: 0.01 ug/l (ref 0.00–0.06)

## 2018-05-08 MED ORDER — GLUCOSE BLOOD VI STRP: ORAL_STRIP | 2 refills | Status: DC

## 2018-05-08 MED ORDER — AMOXICILLIN-POT CLAVULANATE 875-125 MG PO TABS: 1.0000 | ORAL_TABLET | Freq: Two times a day (BID) | ORAL | 0 refills | Status: DC

## 2018-05-08 NOTE — Patient Instructions (Signed)
For recent tooth pain, I did go ahead and prescribe the Augmentin antibiotic.  Also recommend that you do warm salt water rinses to decrease swelling of the gingiva above canine tooth.  For diabetes and hypertension, continue current medications.  You report groin rash has resolved with blue Star ointment so no further treatment needed.  You do have recent very transient 1 second atypical chest wall pain when you bend over.  No based on EKG seen in your chart I went ahead and did EKG today.  It does not show any acute ischemic changes that I can see.  Does show sinus rhythm.  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  On review I really do not see any obvious changes except for in 1-lead.  No pain in chest today.  Will get a troponin for caution sake.  Going forward if you have any recurrent chest pain that is worse let me know.  But if you have chest pain that is constant and not easing up.  Or any associated cardiac like signs and symptoms and recommend emergency department evaluation.  Recommend that you could use Tylenol on a trial basis when you work and see if the pain is eliminated.  If so the pain could be muscular or cartilage type pain.  If the pain is recurrent and intermittent/more frequent then could refer you to cardiologist for work-up as well.  Follow-up in 7 to 10 days or as needed.

## 2018-05-08 NOTE — Progress Notes (Signed)
Subjective:    Patient ID: Russell Shannon, male    DOB: 17-May-1950, 68 y.o.   MRN: [id number]  HPI   Pt in for evaluation.  Pt left upper gum swollen and some bleeding. He stats tooth started hurting 2 wks ago. He states has appointment with dentist one week from today.  Pt groin did get better. He states nystatin did not help but blue star ointment cleared area up.  Pt bp is well controlled.  Pt has rare intermittent sharp transient pain last second when he bends over and then stands erect. Last for one second then subsides. Happens randomly. Usually at work. Sometimes at home. No associated cardiac type signs or symptoms such as jaw pain, sob,nausea, vomiting. arm pain, or sweating. Last time had discomfort was about 24 hours ago.   Review of Systems  Constitutional: Negative for chills, fatigue and fever.  HENT:       Tooth pain.  Respiratory: Negative for choking, chest tightness, wheezing and stridor.   Cardiovascular: Negative for chest pain.  Gastrointestinal: Negative for abdominal pain.  Musculoskeletal: Negative for back pain, joint swelling and neck pain.  Skin: Negative for rash.       Cleared up.  Neurological: Negative for dizziness and headaches.  Hematological: Negative for adenopathy. Does not bruise/bleed easily.  Psychiatric/Behavioral: Negative for behavioral problems and confusion. The patient is not nervous/anxious.    Past Medical History:  Diagnosis Date  . Arthritis    hips  . Diabetes mellitus   . Gout   . Hyperlipidemia   . Hypertension      Social History   Socioeconomic History  . Marital status: Married    Spouse name: Not on file  . Number of children: Not on file  . Years of education: Not on file  . Highest education level: Not on file  Occupational History  . Not on file  Social Needs  . Financial resource strain: Not on file  . Food insecurity:    Worry: Not on file    Inability: Not on file  . Transportation needs:   Medical: Not on file    Non-medical: Not on file  Tobacco Use  . Smoking status: Never Smoker  . Smokeless tobacco: Never Used  Substance and Sexual Activity  . Alcohol use: No  . Drug use: No  . Sexual activity: Yes  Lifestyle  . Physical activity:    Days per week: Not on file    Minutes per session: Not on file  . Stress: Not on file  Relationships  . Social connections:    Talks on phone: Not on file    Gets together: Not on file    Attends religious service: Not on file    Active member of club or organization: Not on file    Attends meetings of clubs or organizations: Not on file    Relationship status: Not on file  . Intimate partner violence:    Fear of current or ex partner: Not on file    Emotionally abused: Not on file    Physically abused: Not on file    Forced sexual activity: Not on file  Other Topics Concern  . Not on file  Social History Narrative  . Not on file    Past Surgical History:  Procedure Laterality Date  . APPENDECTOMY  1971  . laceration hand Left 1975  . LUNG BIOPSY  1980    Family History  Problem Relation Age of Onset  .  Hypertension Mother   . Hypertension Father   . Prostate cancer Brother   . Colon cancer Neg Hx   . Esophageal cancer Neg Hx   . Stomach cancer Neg Hx   . Rectal cancer Neg Hx   . Pancreatic cancer Neg Hx     No Known Allergies  Current Outpatient Medications on File Prior to Visit  Medication Sig Dispense Refill  . allopurinol (ZYLOPRIM) 300 MG tablet Take 1 tablet (300 mg total) by mouth daily. 30 tablet 6  . amLODipine (NORVASC) 10 MG tablet Take 1 tablet (10 mg total) by mouth daily. 90 tablet 1  . aspirin EC 81 MG tablet Take 81 mg by mouth daily.    . blood glucose meter kit and supplies Check Blood Sugar 1-2 times a day. 1 each 0  . blood glucose meter kit and supplies Check blood sugar twice a day ( E11.9). 1 each 0  . Calcium Polycarbophil (FIBER-CAPS PO) Take by mouth daily.    . cyclobenzaprine  (FLEXERIL) 5 MG tablet Take 1 tablet (5 mg total) by mouth at bedtime. 10 tablet 0  . hydrocortisone (ANUSOL-HC) 25 MG suppository Place 1 suppository (25 mg total) rectally 2 (two) times daily. 14 suppository 0  . Lancets (ONETOUCH ULTRASOFT) lancets Check sugar twice a day E11.9 100 each 12  . meloxicam (MOBIC) 7.5 MG tablet 1-2 tab po q day 30 tablet 0  . metFORMIN (GLUCOPHAGE) 500 MG tablet Take 2 tablets (1,000 mg total) by mouth 2 (two) times daily. 360 tablet 0  . nystatin-triamcinolone ointment (MYCOLOG) Apply 1 application topically 2 (two) times daily. 30 g 1  . omeprazole (PRILOSEC) 20 MG capsule Take 20 mg by mouth daily.    . predniSONE (DELTASONE) 10 MG tablet 6 tabs po days 1-2, 5 tabs po days 3-4, 4 tabs po days 5-6, 3 tabs po days 7-8, 2 tabs po days 9-10, 1 tab po days 11-12 42 tablet 0  . simvastatin (ZOCOR) 40 MG tablet TAKE 1 TABLET AT BEDTIME 90 tablet 3  . sitaGLIPtin (JANUVIA) 25 MG tablet Take 1 tablet (25 mg total) by mouth daily. 30 tablet 3  . traMADol (ULTRAM) 50 MG tablet Take 1 tablet (50 mg total) by mouth every 6 (six) hours as needed. 20 tablet 0  . TRUEPLUS LANCETS 33G MISC CHECK BLOOD SUGAR  1  TO  2  TIMES  A  DAY 100 each 0   Current Facility-Administered Medications on File Prior to Visit  Medication Dose Route Frequency Provider Last Rate Last Dose  . 0.9 %  sodium chloride infusion  500 mL Intravenous Continuous Irene Shipper, MD        BP 131/79   Pulse 72   Temp 97.8 F (36.6 C) (Oral)   Resp 16   Ht '5\' 9"'  (1.753 m)   Wt 196 lb (88.9 kg)   SpO2 99%   BMI 28.94 kg/m       Objective:   Physical Exam  General  Mental Status - Alert. General Appearance - Well groomed. Not in acute distress.  Skin Rashes- No Rashes.  HEENT Head- Normal. Ear Auditory Canal - Left- Normal. Right - Normal.Tympanic Membrane- Left- Normal. Right- Normal. Eye Sclera/Conjunctiva- Left- Normal. Right- Normal. Nose & Sinuses Nasal Mucosa- Left-  Boggy and  Congested. Right-  Boggy and  Congested.Bilateral no maxillary and no  frontal sinus pressure. Mouth & Throat Lips: Upper Lip- Normal: no dryness, cracking, pallor, cyanosis, or vesicular eruption. Lower  Lip-Normal: no dryness, cracking, pallor, cyanosis or vesicular eruption. Buccal Mucosa- Bilateral- No Aphthous ulcers. Oropharynx- No Discharge or Erythema. Tonsils: Characteristics- Bilateral- No Erythema or Congestion. Size/Enlargement- Bilateral- No enlargement. Discharge- bilateral-None. ,  Mouth- upper left side of mouth/gum area swollen and mild tender above left canine tooth area.  Neck Neck- Supple. No Masses.   Chest and Lung Exam Auscultation: Breath Sounds:-Clear even and unlabored.  Cardiovascular Auscultation:Rythm- Regular, rate and rhythm. Murmurs & Other Heart Sounds:Ausculatation of the heart reveal- No Murmurs.  Lymphatic Head & Neck General Head & Neck Lymphatics: Bilateral: Description- No Localized lymphadenopathy.       Assessment & Plan:  40 minutes spent with pt. 50% of time spent discussing his tooth pain, chronic conditions, resolved rash and his new atypical chest pain for which ekg and troponin was ordered.

## 2018-05-11 ENCOUNTER — Other Ambulatory Visit: Payer: Self-pay | Admitting: Medical

## 2018-06-02 ENCOUNTER — Telehealth: Payer: Self-pay | Admitting: Medical

## 2018-06-02 NOTE — Telephone Encounter (Signed)
Copied from CRM [id number]. Topic: Quick Communication - See Telephone Encounter >> Jun 02, 2018  4:14 PM Terisa Starr wrote: CRM for notification. See Telephone encounter for: 06/02/18.  Patient had an OV with Ramon Dredge on 3/20 for tooth pain. He said he was unable to see the dentist to Covid-19. He would like to know could Ramon Dredge call in another round of antibiotics for him. If patient needs virtual give him a call.  [id number] - [place], [place] - [address] [place] [address] Phone: [phone] Fax: [phone]

## 2018-06-03 ENCOUNTER — Ambulatory Visit (INDEPENDENT_AMBULATORY_CARE_PROVIDER_SITE_OTHER): Payer: BLUE CROSS/BLUE SHIELD | Admitting: Medical

## 2018-06-03 ENCOUNTER — Other Ambulatory Visit: Payer: Self-pay

## 2018-06-03 ENCOUNTER — Encounter: Payer: Self-pay | Admitting: Medical

## 2018-06-03 VITALS — BP 137/79

## 2018-06-03 DIAGNOSIS — K0889 Other specified disorders of teeth and supporting structures: Secondary | ICD-10-CM | POA: Diagnosis not present

## 2018-06-03 MED ORDER — AMOXICILLIN-POT CLAVULANATE 875-125 MG PO TABS: 1.0000 | ORAL_TABLET | Freq: Two times a day (BID) | ORAL | 0 refills | Status: DC

## 2018-06-03 NOTE — Telephone Encounter (Signed)
Virtual Visit 

## 2018-06-03 NOTE — Progress Notes (Signed)
   Subjective:    Patient ID: Russell Shannon, male    DOB: [birth year], 68 y.o.   MRN: [id number]  HPI  Virtual Visit via Video Note  I connected with Russell Shannon on 06/03/18 at  1:00 PM EDT by a video enabled telemedicine application and verified that I am speaking with the correct person using two identifiers.   I discussed the limitations of evaluation and management by telemedicine and the availability of in person appointments. The patient expressed understanding and agreed to proceed.  History of Present Illness:   Pt in with left lower side tooth pain and swelling. Pt state left upper tooth is loose and hurts. Pt had visit on 05/08/2018. I gave augmentin antibiotic and he states swelling did decrease some but then reswelled about one week ago. Pt has no fever, no chills or sweats. Pt states no problems swollowing or breathing.   Pt was talking to dentist in [place] where he was working and he had plans to get evaluation after antibiotic but then viral pandemic situation worsened and they told him they could evaluate him Jul 15, 2018.   Observations/Objective: On inspsection pain left side of face more in lower jaw area has moderate swelling.  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 Will prescribe augmentin antibiotic again as that did help but counseled final solution is to see dentist for likely tooth extraction. Gave him number to dentist that I used about 2 weeks ago for other patient. They were seeing dental emergencies. I asked pt to call today to get scheduled and then give me update on when his upcoming appointment is. He will update me by my chart or call. He has some left over tramadol from prior visit which he can use for pain.  Follow up date to be determined. I did explain that if dentist office I gave him falls through let me know and could try other. Pt expressed understanding.  Follow Up Instructions:    I discussed the assessment and  treatment plan with the patient. The patient was provided an opportunity to ask questions and all were answered. The patient agreed with the plan and demonstrated an understanding of the instructions.   The patient was advised to call back or seek an in-person evaluation if the symptoms worsen or if the condition fails to improve as anticipated.  I provided 15 minutes of non-face-to-face time during this encounter.   Esperanza Richters, PA-C   Review of Systems     Objective:   Physical Exam  See objective.      Assessment & Plan:

## 2018-06-03 NOTE — Telephone Encounter (Signed)
He can set up virtual visit. But this is his second request for antibiotics and needs to see a dentist. Would you ask how many places he called? Dentist will see emergencies. Can set up virtual. Thanks.

## 2018-06-03 NOTE — Patient Instructions (Signed)
Recurrent tooth infection as before. Will prescribe augmentin antibiotic again as that did help but counseled final solution is to see dentist for likely tooth extraction. Gave him number to dentist that I used about 2 weeks ago for other patient. They were seeing dental emergencies. I asked pt to call today to get scheduled and then give me update on when his upcoming appointment is. He will update me by my chart or call. He has some left over tramadol from prior visit which he can use for pain.  Follow up date to be determined. I did explain that if dentist office I gave him falls through let me know and could try other. Pt expressed understanding.

## 2018-06-03 NOTE — Telephone Encounter (Signed)
Virtual visit scheduled.  

## 2018-06-18 ENCOUNTER — Telehealth: Payer: Self-pay | Admitting: Medical

## 2018-06-18 ENCOUNTER — Other Ambulatory Visit: Payer: Self-pay

## 2018-06-18 MED ORDER — COLCHICINE 0.6 MG PO TABS: 0.6000 mg | ORAL_TABLET | Freq: Two times a day (BID) | ORAL | 0 refills | Status: DC

## 2018-06-18 NOTE — Telephone Encounter (Signed)
Copied from CRM [id number]. Topic: General - Other >> Jun 18, 2018 12:30 PM Elliot Gault wrote: Relation to pt: self  Call back number:[phone] Pharmacy: [id number] - [place], [place] - [address] [facility] [address] [phone] (Phone) [phone] (Fax)    Reason for call:  Patient requesting colchicine for left foot gout, and a pain medication due to the discomfort, please advise

## 2018-06-18 NOTE — Telephone Encounter (Signed)
Copied from CRM #[id number]. Topic: General - Other >> Jun 18, 2018 12:30 PM Bell, Tiffany M wrote: Relation to pt: self  Call back number:[phone] Pharmacy: [id number] - [place], [place] - [address] [phone] (Phone) [phone] (Fax)    Reason for call:  Patient requesting colchicine for left foot gout, and a pain medication due to the discomfort, please advise 

## 2018-06-18 NOTE — Telephone Encounter (Signed)
I did send in colchicine for patient. I wrote 60 tab rx. Advise start and update me how he is in 5 days. Explain to pt their is possible reaction with simvastatin and colchicine. If he gets any bodyaches or muscle pains then stops colchicine and let me know.If brief 5 day tx stops flare then can stop colchicine. He can use other tabs for periodic flare.

## 2018-06-19 NOTE — Telephone Encounter (Signed)
Follow up call made to patient. States his toe is hurting so bad he is on his way to Dr. As we speak

## 2018-06-24 ENCOUNTER — Other Ambulatory Visit: Payer: Self-pay | Admitting: Medical

## 2018-07-20 ENCOUNTER — Ambulatory Visit: Payer: BLUE CROSS/BLUE SHIELD | Admitting: Medical

## 2018-08-07 ENCOUNTER — Other Ambulatory Visit: Payer: Self-pay | Admitting: Medical

## 2018-08-07 MED ORDER — TRUEPLUS LANCETS 33G MISC: 12 refills | Status: DC

## 2018-08-07 MED ORDER — AMLODIPINE BESYLATE 10 MG PO TABS: 10.0000 mg | ORAL_TABLET | Freq: Every day | ORAL | 0 refills | Status: DC

## 2018-08-07 MED ORDER — METFORMIN HCL 500 MG PO TABS: 1000.0000 mg | ORAL_TABLET | Freq: Two times a day (BID) | ORAL | 0 refills | Status: DC

## 2018-08-07 NOTE — Telephone Encounter (Signed)
Requested Rx's sent.  

## 2018-08-07 NOTE — Telephone Encounter (Signed)
Medication Refill - Medication: Amlodipine, metformin, lancets  Has the patient contacted their pharmacy? Yes.   (Agent: If no, request that the patient contact the pharmacy for the refill.) (Agent: If yes, when and what did the pharmacy advise?)  Preferred Pharmacy (with phone number or street name):  [id number] - [place], [place] - [address]  [place]. [place]  [address]  Phone: [phone] Fax: [phone]  Not a 24 hour pharmacy; exact hours not known.     Agent: Please be advised that RX refills may take up to 3 business days. We ask that you follow-up with your pharmacy.

## 2018-08-13 ENCOUNTER — Other Ambulatory Visit: Payer: Self-pay | Admitting: Family Medicine

## 2018-09-21 ENCOUNTER — Ambulatory Visit: Payer: Medicare Other | Admitting: Family Medicine

## 2018-09-21 ENCOUNTER — Encounter: Payer: Self-pay | Admitting: Family Medicine

## 2018-09-21 ENCOUNTER — Other Ambulatory Visit: Payer: Self-pay

## 2018-09-21 VITALS — BP 138/85 | HR 73 | Ht 69.0 in | Wt 205.0 lb

## 2018-09-21 DIAGNOSIS — M5442 Lumbago with sciatica, left side: Secondary | ICD-10-CM | POA: Diagnosis not present

## 2018-09-21 MED ORDER — GABAPENTIN 100 MG PO CAPS: 100.0000 mg | ORAL_CAPSULE | Freq: Three times a day (TID) | ORAL | 0 refills | Status: DC | PRN

## 2018-09-21 MED ORDER — BACLOFEN 10 MG PO TABS: 5.0000 mg | ORAL_TABLET | Freq: Two times a day (BID) | ORAL | 0 refills | Status: DC | PRN

## 2018-09-21 MED ORDER — PREDNISONE 5 MG PO TABS: ORAL_TABLET | ORAL | 0 refills | Status: DC

## 2018-09-21 NOTE — Progress Notes (Signed)
Russell Shannon - 68 y.o. male MRN [id number]  Date of birth: [birth year]  SUBJECTIVE:  Including CC & ROS.  Chief Complaint  Patient presents with  . Hip Pain    left hip    Russell Shannon is a 68 y.o. male that is presenting with acute on chronic left lower back pain as well as intermittent sciatic type symptoms.  The pain is acute and severe.  It is sharp and throbbing.  The pain is become constant and he is unable to sit normally.  The pain is worse with sitting on the affected side or lying on that side.  Feels similar to previous exacerbations.  Denies any specific inciting event.  Has intermittent radicular symptoms.  Denies any saddle anesthesia or urinary incontinence.  He had improvement with prior epidurals.  This pain seems similar to the previous pain he has suffered.  Independent review of the lumbar MRI from 2/12 shows high-grade spinal stenosis L4-5.  Shows left foraminal impingement L5-S1.  Different levels of disc and facet degeneration.   Review of Systems  Constitutional: Negative for fever.  HENT: Negative for congestion.   Respiratory: Negative for cough.   Cardiovascular: Negative for chest pain.  Gastrointestinal: Negative for abdominal pain.  Musculoskeletal: Positive for arthralgias and gait problem.  Skin: Negative for color change.  Neurological: Negative for weakness.  Hematological: Negative for adenopathy.    HISTORY: Past Medical, Surgical, Social, and Family History Reviewed & Updated per EMR.   Pertinent Historical Findings include:  Past Medical History:  Diagnosis Date  . Arthritis    hips  . Diabetes mellitus   . Gout   . Hyperlipidemia   . Hypertension     Past Surgical History:  Procedure Laterality Date  . APPENDECTOMY  1971  . laceration hand Left 1975  . LUNG BIOPSY  1980    No Known Allergies  Family History  Problem Relation Age of Onset  . Hypertension Mother   . Hypertension Father   . Prostate cancer Brother   .  Colon cancer Neg Hx   . Esophageal cancer Neg Hx   . Stomach cancer Neg Hx   . Rectal cancer Neg Hx   . Pancreatic cancer Neg Hx      Social History   Socioeconomic History  . Marital status: Married    Spouse name: Not on file  . Number of children: Not on file  . Years of education: Not on file  . Highest education level: Not on file  Occupational History  . Not on file  Social Needs  . Financial resource strain: Not on file  . Food insecurity    Worry: Not on file    Inability: Not on file  . Transportation needs    Medical: Not on file    Non-medical: Not on file  Tobacco Use  . Smoking status: Never Smoker  . Smokeless tobacco: Never Used  Substance and Sexual Activity  . Alcohol use: No  . Drug use: No  . Sexual activity: Yes  Lifestyle  . Physical activity    Days per week: Not on file    Minutes per session: Not on file  . Stress: Not on file  Relationships  . Social Herbalist on phone: Not on file    Gets together: Not on file    Attends religious service: Not on file    Active member of club or organization: Not on file    Attends meetings of  clubs or organizations: Not on file    Relationship status: Not on file  . Intimate partner violence    Fear of current or ex partner: Not on file    Emotionally abused: Not on file    Physically abused: Not on file    Forced sexual activity: Not on file  Other Topics Concern  . Not on file  Social History Narrative  . Not on file     PHYSICAL EXAM:  VS: BP 138/85   Pulse 73   Ht 5\' 9"  (1.753 m)   Wt 205 lb (93 kg)   BMI 30.27 kg/m  Physical Exam Gen: NAD, alert, cooperative with exam, well-appearing ENT: normal lips, normal nasal mucosa,  Eye: normal EOM, normal conjunctiva and lids CV:  no edema, +2 pedal pulses   Resp: no accessory muscle use, non-labored,   Skin: no rashes, no areas of induration  Neuro: normal tone, normal sensation to touch Psych:  normal insight, alert and  oriented MSK:  Back/left leg: No tenderness to palpation over the greater trochanter. Some tenderness palpation over the left paraspinal muscle just above the iliac crest. Normal strength resistance with hip flexion. Normal internal and external rotation of the hip. Normal knee flexion and extension. Negative straight leg raise. Neurovascularly intact     ASSESSMENT & PLAN:   No problem-specific Assessment & Plan notes found for this encounter.

## 2018-09-21 NOTE — Patient Instructions (Signed)
Nice to meet you Please try the prednisone. This may make your blood sugar to elevate Please try the muscle relaxer. You can try this at night. This may make you sleepy.  Please start with the gabapentin with one pill at night. You can increase this to two or three times daily as you tolerate.  Please try the exercises  Please try heat on the lower back   Please send me a message in MyChart with any questions or updates.  Please see me back in 2-3 weeks.   --Dr. Raeford Razor

## 2018-09-22 NOTE — Assessment & Plan Note (Signed)
This pain seems to be more localized to the lower back as opposed to being radicular in nature.  Less likely for spinal stenosis to be the source.  Did have a foraminal impingement at L5-S1.  Could have spasm related to different facet changes in the lower lumbar spine. -Baclofen. -Prednisone. -Gabapentin. -If no improvement can consider epidural or facet injections.  Could consider physical therapy.

## 2018-10-11 ENCOUNTER — Other Ambulatory Visit: Payer: Self-pay | Admitting: Family Medicine

## 2018-10-11 DIAGNOSIS — M5442 Lumbago with sciatica, left side: Secondary | ICD-10-CM

## 2018-10-12 ENCOUNTER — Other Ambulatory Visit: Payer: Self-pay

## 2018-10-12 ENCOUNTER — Encounter: Payer: Self-pay | Admitting: Family Medicine

## 2018-10-12 ENCOUNTER — Ambulatory Visit (INDEPENDENT_AMBULATORY_CARE_PROVIDER_SITE_OTHER): Payer: Medicare Other | Admitting: Family Medicine

## 2018-10-12 DIAGNOSIS — M5442 Lumbago with sciatica, left side: Secondary | ICD-10-CM | POA: Diagnosis not present

## 2018-10-12 MED ORDER — GABAPENTIN 100 MG PO CAPS: 100.0000 mg | ORAL_CAPSULE | Freq: Three times a day (TID) | ORAL | 1 refills | Status: AC | PRN

## 2018-10-12 NOTE — Assessment & Plan Note (Signed)
Pain is improved from previous.  - refilled gabapentin as he thinks he lost his previous prescription  - counseled on HEP and supportive care - if no improvement consider PT.

## 2018-10-12 NOTE — Progress Notes (Signed)
Russell Shannon - 68 y.o. male MRN [id number]  Date of birth: 22-Dec-1950  SUBJECTIVE:  Including CC & ROS.  Chief Complaint  Patient presents with  . Follow-up    follow up for left hip    Russell Shannon is a 68 y.o. male that is following up for his ongoing low back and left sciatica type pain.  He feels improvement since his last visit.  He is been taking different medications.  The pain is intermittent in nature.  Is mild to moderate.  Seems to be occurring still in the left lower back with some radiation down the left lower leg.  Feels like the gabapentin may have helped some.   Review of Systems  Constitutional: Negative for fever.  HENT: Negative for congestion.   Respiratory: Negative for cough.   Cardiovascular: Negative for chest pain.  Gastrointestinal: Negative for abdominal pain.  Musculoskeletal: Positive for arthralgias and back pain.  Skin: Negative for color change.  Neurological: Negative for weakness.  Hematological: Negative for adenopathy.    HISTORY: Past Medical, Surgical, Social, and Family History Reviewed & Updated per EMR.   Pertinent Historical Findings include:  Past Medical History:  Diagnosis Date  . Arthritis    hips  . Diabetes mellitus   . Gout   . Hyperlipidemia   . Hypertension     Past Surgical History:  Procedure Laterality Date  . APPENDECTOMY  1971  . laceration hand Left 1975  . LUNG BIOPSY  1980    No Known Allergies  Family History  Problem Relation Age of Onset  . Hypertension Mother   . Hypertension Father   . Prostate cancer Brother   . Colon cancer Neg Hx   . Esophageal cancer Neg Hx   . Stomach cancer Neg Hx   . Rectal cancer Neg Hx   . Pancreatic cancer Neg Hx      Social History   Socioeconomic History  . Marital status: Married    Spouse name: Not on file  . Number of children: Not on file  . Years of education: Not on file  . Highest education level: Not on file  Occupational History  . Not on  file  Social Needs  . Financial resource strain: Not on file  . Food insecurity    Worry: Not on file    Inability: Not on file  . Transportation needs    Medical: Not on file    Non-medical: Not on file  Tobacco Use  . Smoking status: Never Smoker  . Smokeless tobacco: Never Used  Substance and Sexual Activity  . Alcohol use: No  . Drug use: No  . Sexual activity: Yes  Lifestyle  . Physical activity    Days per week: Not on file    Minutes per session: Not on file  . Stress: Not on file  Relationships  . Social Musicianconnections    Talks on phone: Not on file    Gets together: Not on file    Attends religious service: Not on file    Active member of club or organization: Not on file    Attends meetings of clubs or organizations: Not on file    Relationship status: Not on file  . Intimate partner violence    Fear of current or ex partner: Not on file    Emotionally abused: Not on file    Physically abused: Not on file    Forced sexual activity: Not on file  Other Topics Concern  .  Not on file  Social History Narrative  . Not on file     PHYSICAL EXAM:  VS: BP 138/80   Ht 5\' 9"  (1.753 m)   Wt 208 lb (94.3 kg)   BMI 30.72 kg/m  Physical Exam Gen: NAD, alert, cooperative with exam, well-appearing ENT: normal lips, normal nasal mucosa,  Eye: normal EOM, normal conjunctiva and lids CV:  no edema, +2 pedal pulses   Resp: no accessory muscle use, non-labored,   Skin: no rashes, no areas of induration  Neuro: normal tone, normal sensation to touch Psych:  normal insight, alert and oriented MSK:  Back:  No TTP over the left GT  Normal IR and ER of the left hip  Normal strength to resistance with hip flexion  Negative SLR NVI     ASSESSMENT & PLAN:   Low back pain Pain is improved from previous.  - refilled gabapentin as he thinks he lost his previous prescription  - counseled on HEP and supportive care - if no improvement consider PT.

## 2018-10-12 NOTE — Patient Instructions (Signed)
Good to see you Please try the gabapentin. You can start with one pill at night and then increase to 2 or 3 times daily.  Please continue the home exercises and stretching.  Please try heat on the area  Please send me a message in MyChart with any questions or updates.  Please see me back in 4 weeks.   --Dr. Raeford Razor

## 2018-11-02 ENCOUNTER — Other Ambulatory Visit: Payer: Self-pay | Admitting: Medical

## 2018-11-09 ENCOUNTER — Ambulatory Visit (INDEPENDENT_AMBULATORY_CARE_PROVIDER_SITE_OTHER): Payer: Medicare Other | Admitting: Family Medicine

## 2018-11-09 ENCOUNTER — Encounter: Payer: Self-pay | Admitting: Family Medicine

## 2018-11-09 ENCOUNTER — Other Ambulatory Visit: Payer: Self-pay

## 2018-11-09 DIAGNOSIS — M5442 Lumbago with sciatica, left side: Secondary | ICD-10-CM

## 2018-11-09 MED ORDER — PREDNISONE 5 MG PO TABS: ORAL_TABLET | ORAL | 0 refills | Status: DC

## 2018-11-09 NOTE — Assessment & Plan Note (Signed)
Acute exacerbation of his underlying sciatic symptoms.  Has never went away completely.  Had an epidural performed in February. -Prednisone. -Counseled on home exercise therapy and supportive care. -If no improvement would consider x-ray of the hips.  Could consider physical therapy or another epidural injection.

## 2018-11-09 NOTE — Progress Notes (Signed)
Russell Shannon - 68 y.o. male MRN [id number]  Date of birth: [birth year]  SUBJECTIVE:  Including CC & ROS.  Chief Complaint  Patient presents with  . Follow-up    follow up for left-sided low back    Russell Shannon is a 68 y.o. male that is presenting with acute exacerbation of his underlying sciatica.  He had an MRI in February and received an epidural injection.  Since that time the pain has been intermittent in nature.  He feels like it has not gone away completely.  Most recently the pain seems of gotten worse.  Is becoming more constant and radiating down the left lateral aspect of the leg.  It is sharp and stabbing in nature.  Denies any numbness and tingling.  Denies any saddle anesthesia or urinary incontinence.   Review of Systems  Constitutional: Negative for fever.  HENT: Negative for congestion.   Respiratory: Negative for cough.   Cardiovascular: Negative for chest pain.  Gastrointestinal: Negative for abdominal pain.  Musculoskeletal: Positive for arthralgias and back pain.  Skin: Negative for color change.  Neurological: Negative for weakness.  Hematological: Negative for adenopathy.    HISTORY: Past Medical, Surgical, Social, and Family History Reviewed & Updated per EMR.   Pertinent Historical Findings include:  Past Medical History:  Diagnosis Date  . Arthritis    hips  . Diabetes mellitus   . Gout   . Hyperlipidemia   . Hypertension     Past Surgical History:  Procedure Laterality Date  . APPENDECTOMY  1971  . laceration hand Left 1975  . LUNG BIOPSY  1980    No Known Allergies  Family History  Problem Relation Age of Onset  . Hypertension Mother   . Hypertension Father   . Prostate cancer Brother   . Colon cancer Neg Hx   . Esophageal cancer Neg Hx   . Stomach cancer Neg Hx   . Rectal cancer Neg Hx   . Pancreatic cancer Neg Hx      Social History   Socioeconomic History  . Marital status: Married    Spouse name: Not on file  .  Number of children: Not on file  . Years of education: Not on file  . Highest education level: Not on file  Occupational History  . Not on file  Social Needs  . Financial resource strain: Not on file  . Food insecurity    Worry: Not on file    Inability: Not on file  . Transportation needs    Medical: Not on file    Non-medical: Not on file  Tobacco Use  . Smoking status: Never Smoker  . Smokeless tobacco: Never Used  Substance and Sexual Activity  . Alcohol use: No  . Drug use: No  . Sexual activity: Yes  Lifestyle  . Physical activity    Days per week: Not on file    Minutes per session: Not on file  . Stress: Not on file  Relationships  . Social Herbalist on phone: Not on file    Gets together: Not on file    Attends religious service: Not on file    Active member of club or organization: Not on file    Attends meetings of clubs or organizations: Not on file    Relationship status: Not on file  . Intimate partner violence    Fear of current or ex partner: Not on file    Emotionally abused: Not on file  Physically abused: Not on file    Forced sexual activity: Not on file  Other Topics Concern  . Not on file  Social History Narrative  . Not on file     PHYSICAL EXAM:  VS: BP 135/86   Ht 5\' 9"  (1.753 m)   Wt 205 lb (93 kg)   BMI 30.27 kg/m  Physical Exam Gen: NAD, alert, cooperative with exam, well-appearing ENT: normal lips, normal nasal mucosa,  Eye: normal EOM, normal conjunctiva and lids CV:  no edema, +2 pedal pulses   Resp: no accessory muscle use, non-labored,  Skin: no rashes, no areas of induration  Neuro: normal tone, normal sensation to touch Psych:  normal insight, alert and oriented MSK:  Back: No tenderness to palpation of the SI joint or lumbar midline spine. No tenderness palpation of the paraspinal muscles or piriformis. Normal internal and external rotation of the left hip. Normal strength resistance with hip flexion,  knee flexion extension, plantar flexion dorsiflexion. Neurovascularly intact     ASSESSMENT & PLAN:   Low back pain Acute exacerbation of his underlying sciatic symptoms.  Has never went away completely.  Had an epidural performed in February. -Prednisone. -Counseled on home exercise therapy and supportive care. -If no improvement would consider x-ray of the hips.  Could consider physical therapy or another epidural injection.

## 2018-11-09 NOTE — Patient Instructions (Signed)
Good to see you Please try the exercises  The prednisone may make your sugar increase so keep an eye on it.  Please let me know if the medicine doesn't help with your pain and we may need to order an epidural   Please send me a message in MyChart with any questions or updates.  Please see me back in 4 weeks.   --Dr. Raeford Razor

## 2018-12-07 ENCOUNTER — Ambulatory Visit: Payer: Medicare Other | Admitting: Family Medicine

## 2018-12-07 ENCOUNTER — Encounter: Payer: Self-pay | Admitting: Family Medicine

## 2018-12-07 ENCOUNTER — Other Ambulatory Visit: Payer: Self-pay

## 2018-12-07 VITALS — BP 131/86 | HR 87 | Ht 69.0 in | Wt 206.0 lb

## 2018-12-07 DIAGNOSIS — M5442 Lumbago with sciatica, left side: Secondary | ICD-10-CM

## 2018-12-07 NOTE — Patient Instructions (Signed)
Good to see you  We have put the order in for an epidural.  Please let me know if it helps or not Please let me know if you want to try physical therapy.  Please send me a message in MyChart with any questions or updates.  Please see me back in 2-3 months or sooner if needed.   --Dr. Raeford Razor

## 2018-12-07 NOTE — Progress Notes (Signed)
Russell Shannon - 68 y.o. male MRN [id number]  Date of birth: 1950/05/07  SUBJECTIVE:  Including CC & ROS.  Chief Complaint  Patient presents with  . Follow-up    Russell Shannon is a 68 y.o. male that is presenting with acute worsening of his low back pain and sciatica.  His pain has become more constant and severe.  He experiences more pain down the lateral aspect of the lower leg.  It can occur when he is active.  He is able to alleviate some of the pain when he is sitting or lying down.  The pain is inhibiting from doing normal activities.  He has received epidural injections in the past which seem to have helped.  Has not had any formal physical therapy as of late..   Review of Systems  Constitutional: Negative for fever.  HENT: Negative for congestion.   Respiratory: Negative for cough.   Cardiovascular: Negative for chest pain.  Gastrointestinal: Negative for abdominal pain.  Musculoskeletal: Positive for back pain.  Skin: Negative for color change.  Neurological: Negative for weakness.  Hematological: Negative for adenopathy.    HISTORY: Past Medical, Surgical, Social, and Family History Reviewed & Updated per EMR.   Pertinent Historical Findings include:  Past Medical History:  Diagnosis Date  . Arthritis    hips  . Diabetes mellitus   . Gout   . Hyperlipidemia   . Hypertension     Past Surgical History:  Procedure Laterality Date  . APPENDECTOMY  1971  . laceration hand Left 1975  . LUNG BIOPSY  1980    No Known Allergies  Family History  Problem Relation Age of Onset  . Hypertension Mother   . Hypertension Father   . Prostate cancer Brother   . Colon cancer Neg Hx   . Esophageal cancer Neg Hx   . Stomach cancer Neg Hx   . Rectal cancer Neg Hx   . Pancreatic cancer Neg Hx      Social History   Socioeconomic History  . Marital status: Married    Spouse name: Not on file  . Number of children: Not on file  . Years of education: Not on file  .  Highest education level: Not on file  Occupational History  . Not on file  Social Needs  . Financial resource strain: Not on file  . Food insecurity    Worry: Not on file    Inability: Not on file  . Transportation needs    Medical: Not on file    Non-medical: Not on file  Tobacco Use  . Smoking status: Never Smoker  . Smokeless tobacco: Never Used  Substance and Sexual Activity  . Alcohol use: No  . Drug use: No  . Sexual activity: Yes  Lifestyle  . Physical activity    Days per week: Not on file    Minutes per session: Not on file  . Stress: Not on file  Relationships  . Social Musician on phone: Not on file    Gets together: Not on file    Attends religious service: Not on file    Active member of club or organization: Not on file    Attends meetings of clubs or organizations: Not on file    Relationship status: Not on file  . Intimate partner violence    Fear of current or ex partner: Not on file    Emotionally abused: Not on file    Physically abused: Not on  file    Forced sexual activity: Not on file  Other Topics Concern  . Not on file  Social History Narrative  . Not on file     PHYSICAL EXAM:  VS: BP 131/86   Pulse 87   Ht 5\' 9"  (1.753 m)   Wt 206 lb (93.4 kg)   BMI 30.42 kg/m  Physical Exam Gen: NAD, alert, cooperative with exam, well-appearing ENT: normal lips, normal nasal mucosa,  Eye: normal EOM, normal conjunctiva and lids CV:  no edema, +2 pedal pulses   Resp: no accessory muscle use, non-labored,  Skin: no rashes, no areas of induration  Neuro: normal tone, normal sensation to touch Psych:  normal insight, alert and oriented MSK:  Back/left hip: No tenderness to palpation over the greater trochanter. Some tenderness to palpation over the left paraspinal muscles in the lumbar region. Normal strength resistance with hip flexion. Negative straight leg raise. Neurovascularly intact     ASSESSMENT & PLAN:   Low back pain  Acutely worsening.  Sciatic symptoms are more pressing at this time.  Did get some relief with the epidural previously. -Epidural today -Counseled on home exercise therapy and supportive care. -If no improvement consider physical therapy.

## 2018-12-07 NOTE — Assessment & Plan Note (Signed)
Acutely worsening.  Sciatic symptoms are more pressing at this time.  Did get some relief with the epidural previously. -Epidural today -Counseled on home exercise therapy and supportive care. -If no improvement consider physical therapy.

## 2018-12-09 ENCOUNTER — Ambulatory Visit
Admission: RE | Admit: 2018-12-09 | Discharge: 2018-12-09 | Disposition: A | Discharge status: Home / Self Care | Payer: Medicare Other | Source: Ambulatory Visit | Attending: Family Medicine | Admitting: Family Medicine

## 2018-12-09 DIAGNOSIS — M5442 Lumbago with sciatica, left side: Secondary | ICD-10-CM

## 2018-12-09 MED ORDER — METHYLPREDNISOLONE ACETATE 40 MG/ML INJ SUSP (RADIOLOG
120.0000 mg | Freq: Once | INTRAMUSCULAR | Status: AC
  Administered 2018-12-09: 120 mg via EPIDURAL

## 2018-12-09 MED ORDER — IOPAMIDOL (ISOVUE-M 200) INJECTION 41%
1.0000 mL | Freq: Once | INTRAMUSCULAR | Status: AC
  Administered 2018-12-09: 1 mL via EPIDURAL

## 2018-12-09 NOTE — Discharge Instructions (Signed)

## 2019-02-02 ENCOUNTER — Other Ambulatory Visit: Payer: Self-pay | Admitting: Medical

## 2019-02-02 MED ORDER — ONETOUCH ULTRA VI STRP: ORAL_STRIP | 5 refills | Status: DC

## 2019-02-02 MED ORDER — ONETOUCH ULTRASOFT LANCETS MISC: 5 refills | Status: DC

## 2019-02-03 ENCOUNTER — Ambulatory Visit (INDEPENDENT_AMBULATORY_CARE_PROVIDER_SITE_OTHER): Payer: Medicare Other | Admitting: Medical

## 2019-02-03 ENCOUNTER — Other Ambulatory Visit: Payer: Self-pay

## 2019-02-03 DIAGNOSIS — I10 Essential (primary) hypertension: Secondary | ICD-10-CM

## 2019-02-03 DIAGNOSIS — Z8739 Personal history of other diseases of the musculoskeletal system and connective tissue: Secondary | ICD-10-CM

## 2019-02-03 DIAGNOSIS — E119 Type 2 diabetes mellitus without complications: Secondary | ICD-10-CM

## 2019-02-03 DIAGNOSIS — E785 Hyperlipidemia, unspecified: Secondary | ICD-10-CM | POA: Diagnosis not present

## 2019-02-03 MED ORDER — SIMVASTATIN 40 MG PO TABS: 40.0000 mg | ORAL_TABLET | Freq: Every day | ORAL | 1 refills | Status: DC

## 2019-02-03 NOTE — Patient Instructions (Signed)
Your bp is controlled recently. Continue on current bp medication regimen.   Your sugar level in am good recently and will put in order for future cmp and a1c. May make adjustment to diabetic  regimen on review.   For high cholesterol and diabetes refilled stain/zocor. Future lipid panel placed. Labs to be done fasting.  For gout recheck uric acid. Continue allopurinol.  Follow up date 3 months form future lab date or as needed

## 2019-02-03 NOTE — Progress Notes (Signed)
   Subjective:    Patient ID: Russell Shannon, male    DOB: 26-Jul-1950, 68 y.o.   MRN: [id number]  HPI   Virtual Visit via Telephone Note  I connected with Wynelle Fanny on 02/03/19 at  1:20 PM EST by telephone and verified that I am speaking with the correct person using two identifiers.  Location: Patient: home Provider: office   I discussed the limitations, risks, security and privacy concerns of performing an evaluation and management service by telephone and the availability of in person appointments. I also discussed with the patient that there may be a patient responsible charge related to this service. The patient expressed understanding and agreed to proceed.   History of Present Illness: Pt update me working part time local.  Pt has htn. He check yesterday morning and bp 130/78.  Pt sugar was 119 in morning yesterday fasting.  Pt has high cholesterol and ran out of medication and did ask for refills. He states ran out months ago.  Pt states his gout has been controlled.  Pt states he had tooth pulled that was bothering him in April 2020.  Pt had epidural injection for back pain in oct 2020.  Pt already got flu vaccine and states pneumovaccine.       Observations/Objective: General- no acute distress, pleasant, oriented, normal speech.    Assessment and Plan: Your bp is controlled recently. Continue on current bp medication regimen.   Your sugar level in am good recently and will put in order for future cmp and a1c. May make adjustment to diabetic  regimen on review.   For high cholesterol and diabetes refilled stain/zocor. Future lipid panel placed. Labs to be done fasting.  For gout recheck uric acid. Continue allopurinol.  Follow up date 3 months form future lab date or as needed  Follow Up Instructions:    I discussed the assessment and treatment plan with the patient. The patient was provided an opportunity to ask questions and all were  answered. The patient agreed with the plan and demonstrated an understanding of the instructions.   The patient was advised to call back or seek an in-person evaluation if the symptoms worsen or if the condition fails to improve as anticipated.  I provided 15 minutes of non-face-to-face time during this encounter.   Mackie Pai, PA-C    Review of Systems  Constitutional: Negative for chills, fatigue and fever.  Respiratory: Negative for chest tightness, shortness of breath and wheezing.   Cardiovascular: Negative for chest pain and palpitations.  Gastrointestinal: Negative for abdominal pain.  Genitourinary: Negative for dysuria and frequency.  Musculoskeletal: Negative for back pain.  Skin: Negative for rash.  Neurological: Negative for dizziness, speech difficulty, weakness, numbness and headaches.  Hematological: Negative for adenopathy. Does not bruise/bleed easily.  Psychiatric/Behavioral: Negative for behavioral problems and confusion. The patient is not nervous/anxious.        Objective:   Physical Exam        Assessment & Plan:

## 2019-02-11 ENCOUNTER — Other Ambulatory Visit: Payer: Self-pay | Admitting: Medical

## 2019-02-11 MED ORDER — AMLODIPINE BESYLATE 10 MG PO TABS: 10.0000 mg | ORAL_TABLET | Freq: Every day | ORAL | 0 refills | Status: DC

## 2019-02-11 NOTE — Telephone Encounter (Signed)
Copied from [place] [id number]. Topic: Quick Communication - Rx Refill/Question >> Feb 11, 2019 10:41 AM Yvette Rack wrote: Medication: amLODipine (NORVASC) 10 MG tablet  Has the patient contacted their pharmacy? no  Preferred Pharmacy (with phone number or street name): [id number] - [place], [place] - [address]  Phone: [phone]   Fax: [phone]  Agent: Please be advised that RX refills may take up to 3 business days. We ask that you follow-up with your pharmacy.

## 2019-03-01 ENCOUNTER — Encounter: Payer: Self-pay | Admitting: Family Medicine

## 2019-03-01 ENCOUNTER — Ambulatory Visit: Payer: Medicare Other | Admitting: Family Medicine

## 2019-03-01 ENCOUNTER — Other Ambulatory Visit: Payer: Self-pay

## 2019-03-01 DIAGNOSIS — M5442 Lumbago with sciatica, left side: Secondary | ICD-10-CM

## 2019-03-01 MED ORDER — NAPROXEN 500 MG PO TABS: 500.0000 mg | ORAL_TABLET | Freq: Two times a day (BID) | ORAL | 1 refills | Status: AC | PRN

## 2019-03-01 NOTE — Patient Instructions (Signed)
Good to see you Please try heat if needed   Please try the narpoxen as needed  Please let me know if you want to try physical therapy   Please send me a message in MyChart with any questions or updates.  Please see me back in 6-8 weeks.   --Dr. Jordan Likes

## 2019-03-01 NOTE — Progress Notes (Signed)
Russell Shannon - 69 y.o. male MRN [id number]  Date of birth: Jan 13, 1951  SUBJECTIVE:  Including CC & ROS.  Chief Complaint  Patient presents with  . Follow-up    follow up for left-sided low back    Russell Shannon is a 69 y.o. male that is following up for his low back and sciatic type symptoms.  He has been doing well since the epidural.  He reports intermittent pain from time to time.  Denies any mechanical or giving way symptoms.   Review of Systems See HPI   HISTORY: Past Medical, Surgical, Social, and Family History Reviewed & Updated per EMR.   Pertinent Historical Findings include:  Past Medical History:  Diagnosis Date  . Arthritis    hips  . Diabetes mellitus   . Gout   . Hyperlipidemia   . Hypertension     Past Surgical History:  Procedure Laterality Date  . APPENDECTOMY  1971  . laceration hand Left 1975  . LUNG BIOPSY  1980    No Known Allergies  Family History  Problem Relation Age of Onset  . Hypertension Mother   . Hypertension Father   . Prostate cancer Brother   . Colon cancer Neg Hx   . Esophageal cancer Neg Hx   . Stomach cancer Neg Hx   . Rectal cancer Neg Hx   . Pancreatic cancer Neg Hx      Social History   Socioeconomic History  . Marital status: Married    Spouse name: Not on file  . Number of children: Not on file  . Years of education: Not on file  . Highest education level: Not on file  Occupational History  . Not on file  Tobacco Use  . Smoking status: Never Smoker  . Smokeless tobacco: Never Used  Substance and Sexual Activity  . Alcohol use: No  . Drug use: No  . Sexual activity: Yes  Other Topics Concern  . Not on file  Social History Narrative  . Not on file   Social Determinants of Health   Financial Resource Strain:   . Difficulty of Paying Living Expenses: Not on file  Food Insecurity:   . Worried About Charity fundraiser in the Last Year: Not on file  . Ran Out of Food in the Last Year: Not on file   Transportation Needs:   . Lack of Transportation (Medical): Not on file  . Lack of Transportation (Non-Medical): Not on file  Physical Activity:   . Days of Exercise per Week: Not on file  . Minutes of Exercise per Session: Not on file  Stress:   . Feeling of Stress : Not on file  Social Connections:   . Frequency of Communication with Friends and Family: Not on file  . Frequency of Social Gatherings with Friends and Family: Not on file  . Attends Religious Services: Not on file  . Active Member of Clubs or Organizations: Not on file  . Attends Archivist Meetings: Not on file  . Marital Status: Not on file  Intimate Partner Violence:   . Fear of Current or Ex-Partner: Not on file  . Emotionally Abused: Not on file  . Physically Abused: Not on file  . Sexually Abused: Not on file     PHYSICAL EXAM:  VS: BP 131/82   Pulse 97   Ht 5\' 9"  (1.753 m)   Wt 205 lb (93 kg)   BMI 30.27 kg/m  Physical Exam Gen: NAD, alert,  cooperative with exam, well-appearing ENT: normal lips, normal nasal mucosa,  Eye: normal EOM, normal conjunctiva and lids Skin: no rashes, no areas of induration  Neuro: normal tone, normal sensation to touch Psych:  normal insight, alert and oriented MSK:  Back: Normal flexion and extension. Normal strength resistance with hip flexion, knee flexion extension. Some pain with straight leg testing. Neurovascularly intact     ASSESSMENT & PLAN:   Low back pain Pain is improved since the epidural.  Is able to do his part-time job with limited pain. -Provide naproxen to take as needed. -Counseled on home exercise therapy and supportive care. -Could consider physical therapy.

## 2019-03-02 NOTE — Assessment & Plan Note (Signed)
Pain is improved since the epidural.  Is able to do his part-time job with limited pain. -Provide naproxen to take as needed. -Counseled on home exercise therapy and supportive care. -Could consider physical therapy.

## 2019-04-19 ENCOUNTER — Other Ambulatory Visit: Payer: Self-pay

## 2019-04-19 ENCOUNTER — Ambulatory Visit: Payer: Medicare Other | Admitting: Family Medicine

## 2019-04-19 ENCOUNTER — Encounter: Payer: Self-pay | Admitting: Family Medicine

## 2019-04-19 DIAGNOSIS — M5442 Lumbago with sciatica, left side: Secondary | ICD-10-CM

## 2019-04-19 NOTE — Assessment & Plan Note (Addendum)
Is doing well.  Pain is only intermittent.  Was able to do most things with little to no pain. -Continue gabapentin. -Continue naproxen as needed. -Counseled on home exercise therapy and supportive care. -Prior physical therapy with limited present status. - Could consider repeat epidural

## 2019-04-19 NOTE — Progress Notes (Addendum)
Russell Shannon - 69 y.o. male MRN [id number]  Date of birth: [birth year]  SUBJECTIVE:  Including CC & ROS.  Chief Complaint  Patient presents with  . Follow-up    follow up for left-sided low back    Russell Shannon is a 69 y.o. male that is following up for his low back pain and left-sided sciatica.  His symptoms are intermittent in nature.  He has had significant improvement since the epidural.  He is able do most things with no pain.   Review of Systems See HPI   HISTORY: Past Medical, Surgical, Social, and Family History Reviewed & Updated per EMR.   Pertinent Historical Findings include:  Past Medical History:  Diagnosis Date  . Arthritis    hips  . Diabetes mellitus   . Gout   . Hyperlipidemia   . Hypertension     Past Surgical History:  Procedure Laterality Date  . APPENDECTOMY  1971  . laceration hand Left 1975  . LUNG BIOPSY  1980    Family History  Problem Relation Age of Onset  . Hypertension Mother   . Hypertension Father   . Prostate cancer Brother   . Colon cancer Neg Hx   . Esophageal cancer Neg Hx   . Stomach cancer Neg Hx   . Rectal cancer Neg Hx   . Pancreatic cancer Neg Hx     Social History   Socioeconomic History  . Marital status: Married    Spouse name: Not on file  . Number of children: Not on file  . Years of education: Not on file  . Highest education level: Not on file  Occupational History  . Not on file  Tobacco Use  . Smoking status: Never Smoker  . Smokeless tobacco: Never Used  Substance and Sexual Activity  . Alcohol use: No  . Drug use: No  . Sexual activity: Yes  Other Topics Concern  . Not on file  Social History Narrative  . Not on file   Social Determinants of Health   Financial Resource Strain:   . Difficulty of Paying Living Expenses: Not on file  Food Insecurity:   . Worried About Charity fundraiser in the Last Year: Not on file  . Ran Out of Food in the Last Year: Not on file  Transportation  Needs:   . Lack of Transportation (Medical): Not on file  . Lack of Transportation (Non-Medical): Not on file  Physical Activity:   . Days of Exercise per Week: Not on file  . Minutes of Exercise per Session: Not on file  Stress:   . Feeling of Stress : Not on file  Social Connections:   . Frequency of Communication with Friends and Family: Not on file  . Frequency of Social Gatherings with Friends and Family: Not on file  . Attends Religious Services: Not on file  . Active Member of Clubs or Organizations: Not on file  . Attends Archivist Meetings: Not on file  . Marital Status: Not on file  Intimate Partner Violence:   . Fear of Current or Ex-Partner: Not on file  . Emotionally Abused: Not on file  . Physically Abused: Not on file  . Sexually Abused: Not on file     PHYSICAL EXAM:  VS: BP (!) 141/82   Pulse 82   Ht 5\' 9"  (1.753 m)   Wt 205 lb (93 kg)   BMI 30.27 kg/m  Physical Exam Gen: NAD, alert, cooperative with exam, well-appearing  MSK:  Back: Has good range of motion. Normal strength resistance with hip flexion. Neurovascular intact     ASSESSMENT & PLAN:   Low back pain Is doing well.  Pain is only intermittent.  Was able to do most things with little to no pain. -Continue gabapentin. -Continue naproxen as needed. -Counseled on home exercise therapy and supportive care. -Prior physical therapy with limited present status. - Could consider repeat epidural

## 2019-04-19 NOTE — Patient Instructions (Signed)
Good to see you Please continue stretches and exercises   Please send me a message in MyChart with any questions or updates.  Please see me back in 2-3 months .   --Dr. Jordan Likes

## 2019-04-28 ENCOUNTER — Other Ambulatory Visit: Payer: Self-pay | Admitting: Family Medicine

## 2019-05-04 ENCOUNTER — Other Ambulatory Visit: Payer: Self-pay | Admitting: Medical

## 2019-06-02 ENCOUNTER — Telehealth: Payer: Self-pay | Admitting: Medical

## 2019-06-02 NOTE — Progress Notes (Signed)
  Chronic Care Management   Outreach Note  06/02/2019 Name: Russell Shannon MRN: [id number] DOB: 06/05/1950  Referred by: Esperanza Richters, PA-C Reason for referral : No chief complaint on file.   An unsuccessful telephone outreach was attempted today. The patient was referred to the pharmacist for assistance with care management and care coordination.   Follow Up Plan:   Raynicia Dukes [employer] Scheduler

## 2019-06-21 ENCOUNTER — Other Ambulatory Visit: Payer: Self-pay

## 2019-06-21 ENCOUNTER — Encounter: Payer: Self-pay | Admitting: Family Medicine

## 2019-06-21 ENCOUNTER — Ambulatory Visit: Payer: Medicare Other | Admitting: Family Medicine

## 2019-06-21 DIAGNOSIS — M5442 Lumbago with sciatica, left side: Secondary | ICD-10-CM

## 2019-06-21 NOTE — Patient Instructions (Signed)
Good to see you Don't work too hard   Please send me a message in MyChart with any questions or updates.  Please see me back in 6 months.   --Dr. Jordan Likes

## 2019-06-21 NOTE — Progress Notes (Signed)
Russell Shannon - 69 y.o. male MRN [id number]  Date of birth: 1951-01-04  SUBJECTIVE:  Including CC & ROS.  Chief Complaint  Patient presents with  . Follow-up    Russell Shannon is a 69 y.o. male that is following up for his back pain.  He denies any significant pain in the back.  Has intermittent sciatic type symptoms but much more mild in nature.   Review of Systems See HPI   HISTORY: Past Medical, Surgical, Social, and Family History Reviewed & Updated per EMR.   Pertinent Historical Findings include:  Past Medical History:  Diagnosis Date  . Arthritis    hips  . Diabetes mellitus   . Gout   . Hyperlipidemia   . Hypertension     Past Surgical History:  Procedure Laterality Date  . APPENDECTOMY  1971  . laceration hand Left 1975  . LUNG BIOPSY  1980    Family History  Problem Relation Age of Onset  . Hypertension Mother   . Hypertension Father   . Prostate cancer Brother   . Colon cancer Neg Hx   . Esophageal cancer Neg Hx   . Stomach cancer Neg Hx   . Rectal cancer Neg Hx   . Pancreatic cancer Neg Hx     Social History   Socioeconomic History  . Marital status: Married    Spouse name: Not on file  . Number of children: Not on file  . Years of education: Not on file  . Highest education level: Not on file  Occupational History  . Not on file  Tobacco Use  . Smoking status: Never Smoker  . Smokeless tobacco: Never Used  Substance and Sexual Activity  . Alcohol use: No  . Drug use: No  . Sexual activity: Yes  Other Topics Concern  . Not on file  Social History Narrative  . Not on file   Social Determinants of Health   Financial Resource Strain:   . Difficulty of Paying Living Expenses:   Food Insecurity:   . Worried About Programme researcher, broadcasting/film/video in the Last Year:   . Barista in the Last Year:   Transportation Needs:   . Freight forwarder (Medical):   Marland Kitchen Lack of Transportation (Non-Medical):   Physical Activity:   . Days of  Exercise per Week:   . Minutes of Exercise per Session:   Stress:   . Feeling of Stress :   Social Connections:   . Frequency of Communication with Friends and Family:   . Frequency of Social Gatherings with Friends and Family:   . Attends Religious Services:   . Active Member of Clubs or Organizations:   . Attends Banker Meetings:   Marland Kitchen Marital Status:   Intimate Partner Violence:   . Fear of Current or Ex-Partner:   . Emotionally Abused:   Marland Kitchen Physically Abused:   . Sexually Abused:      PHYSICAL EXAM:  VS: BP (!) 142/88   Pulse 89   Ht 5\' 9"  (1.753 m)   Wt 197 lb (89.4 kg)   BMI 29.09 kg/m  Physical Exam Gen: NAD, alert, cooperative with exam, well-appearing MSK:  Back: Normal strength resistance. Normal gait. Neurovascularly intact     ASSESSMENT & PLAN:   Low back pain Only having mild intermittent symptoms but pain has improved significantly since last year. -Counseled on home exercise therapy and supportive care. -Could always perform epidural again if needed.

## 2019-06-21 NOTE — Assessment & Plan Note (Signed)
Only having mild intermittent symptoms but pain has improved significantly since last year. -Counseled on home exercise therapy and supportive care. -Could always perform epidural again if needed.

## 2019-07-06 ENCOUNTER — Telehealth: Payer: Self-pay | Admitting: Medical

## 2019-07-06 NOTE — Telephone Encounter (Signed)
[name]   Call Back # [phone]  Patient is requesting to change of primary care from [facility] to Arva Chafe, DO   please advise

## 2019-07-06 NOTE — Telephone Encounter (Signed)
Ok to switch 

## 2019-07-06 NOTE — Telephone Encounter (Signed)
OK w me.  

## 2019-07-25 ENCOUNTER — Other Ambulatory Visit: Payer: Self-pay | Admitting: Medical

## 2019-08-01 ENCOUNTER — Other Ambulatory Visit: Payer: Self-pay | Admitting: Medical

## 2019-08-21 ENCOUNTER — Other Ambulatory Visit: Payer: Self-pay | Admitting: Family

## 2019-08-25 NOTE — Telephone Encounter (Signed)
Rx zocor sent in. Please get pt scheduled for office visit before he runs out of med. Early am appointment with me and come in fasting that day for lab.

## 2019-08-25 NOTE — Telephone Encounter (Signed)
Called pt and lvm to return call and schedule an appt. 

## 2019-08-26 NOTE — Telephone Encounter (Signed)
Called pt and lvm to return call.  

## 2019-09-15 ENCOUNTER — Ambulatory Visit: Payer: Medicare Other | Admitting: Family Medicine

## 2019-09-20 ENCOUNTER — Other Ambulatory Visit: Payer: Self-pay | Admitting: Medical

## 2019-10-23 IMAGING — CR DG LUMBAR SPINE 2-3V
3 series · 3 of 3 positions shown · non-contrast
Comparison: Lumbar spine x-rays dated June 20, 2017.

CLINICAL DATA: Chronic low back pain radiating into the left leg.

EXAM:
LUMBAR SPINE - 2-3 VIEW

[t l-spine a.p.]
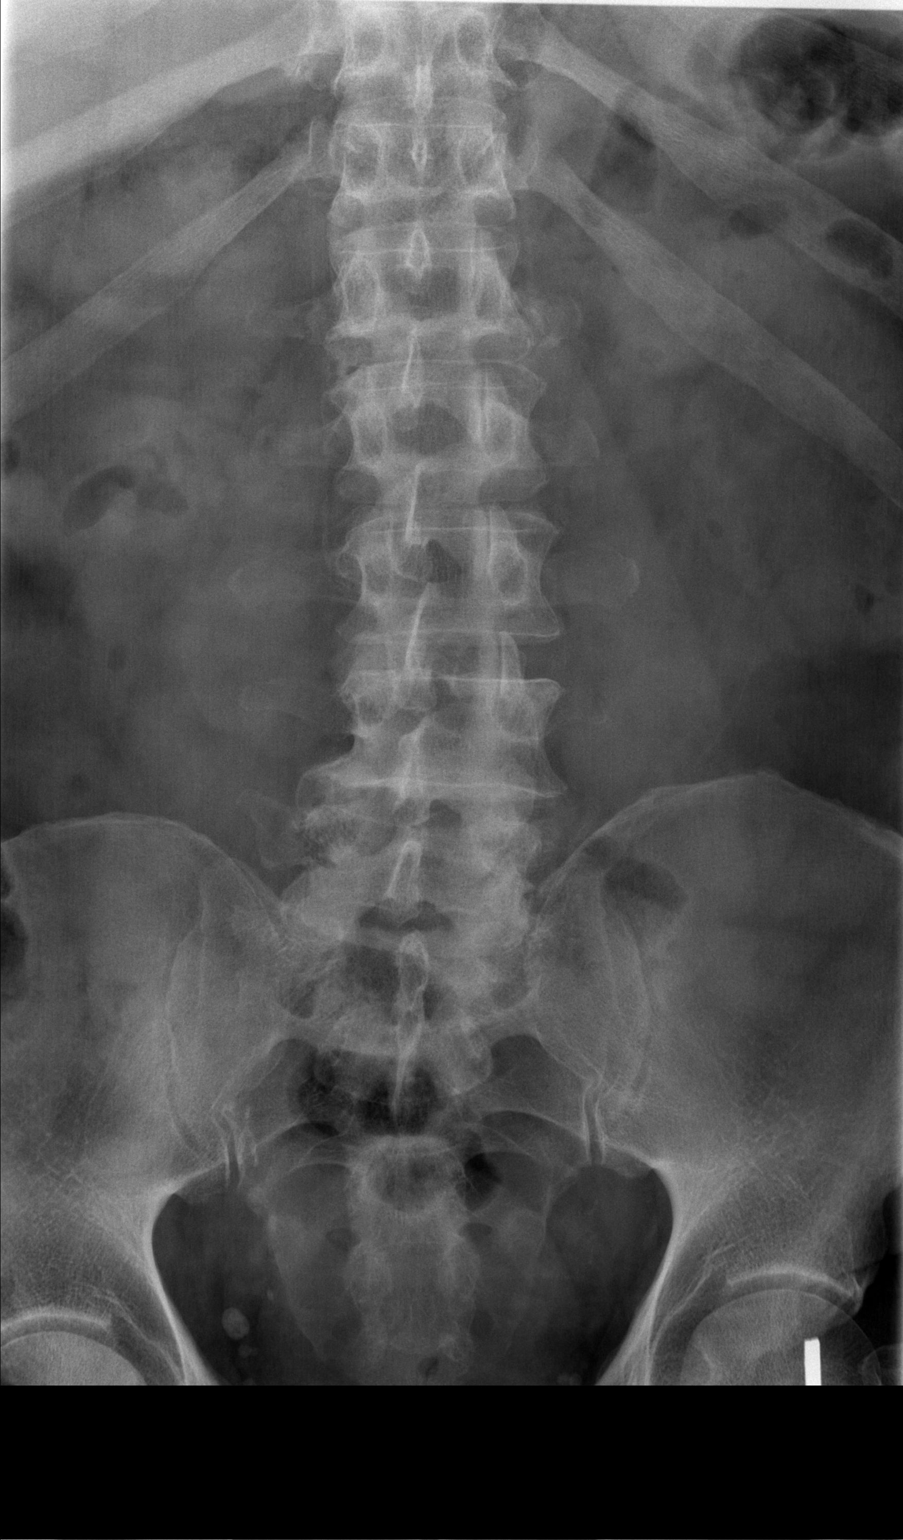

[t l-spine lat]
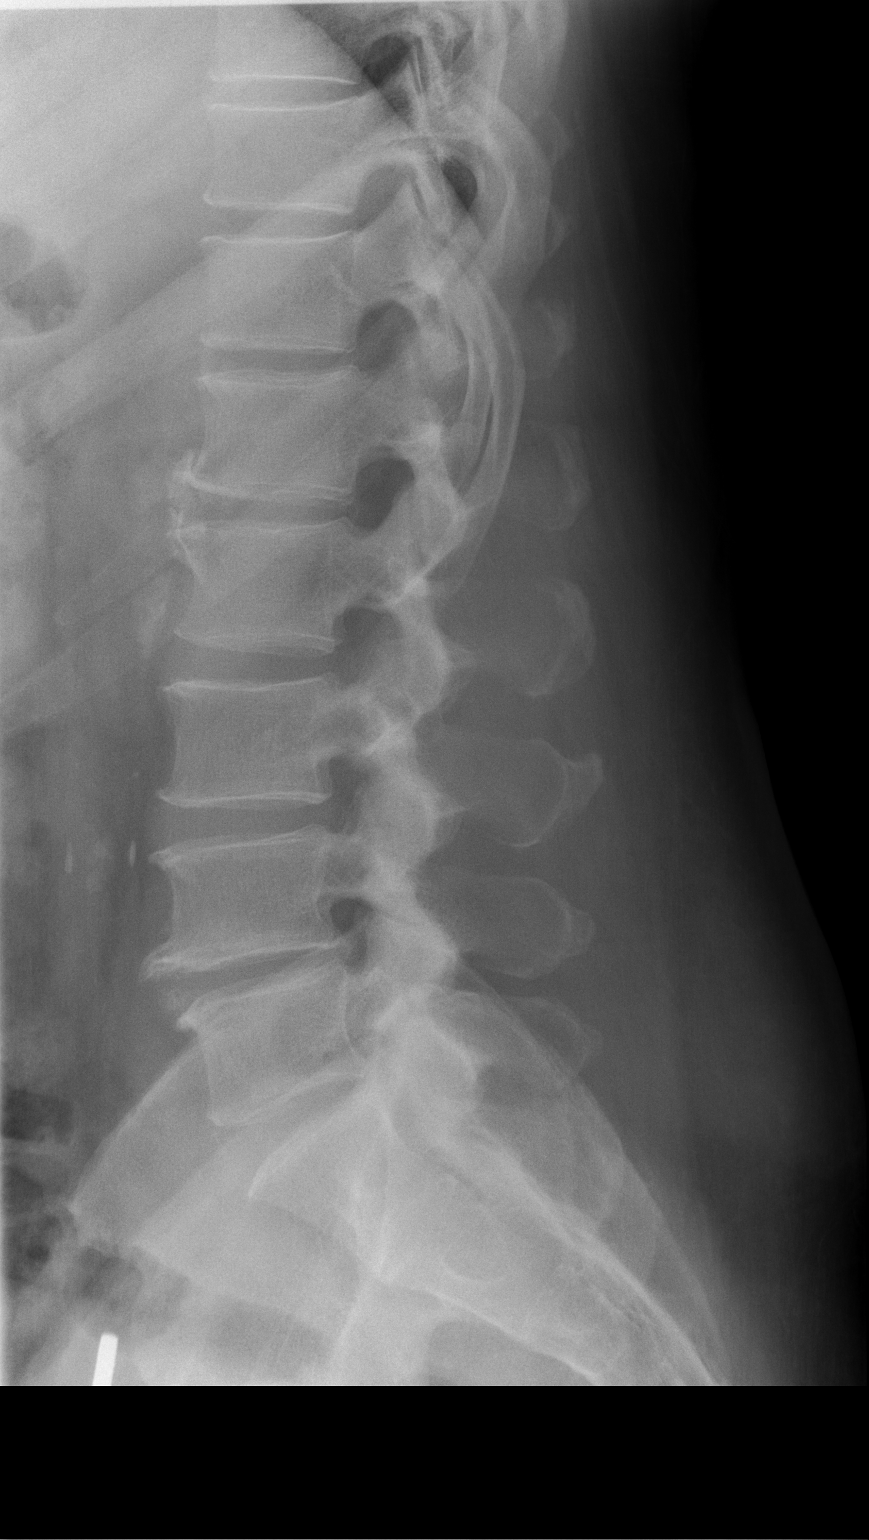

[t l-spine l5-s1 spot]
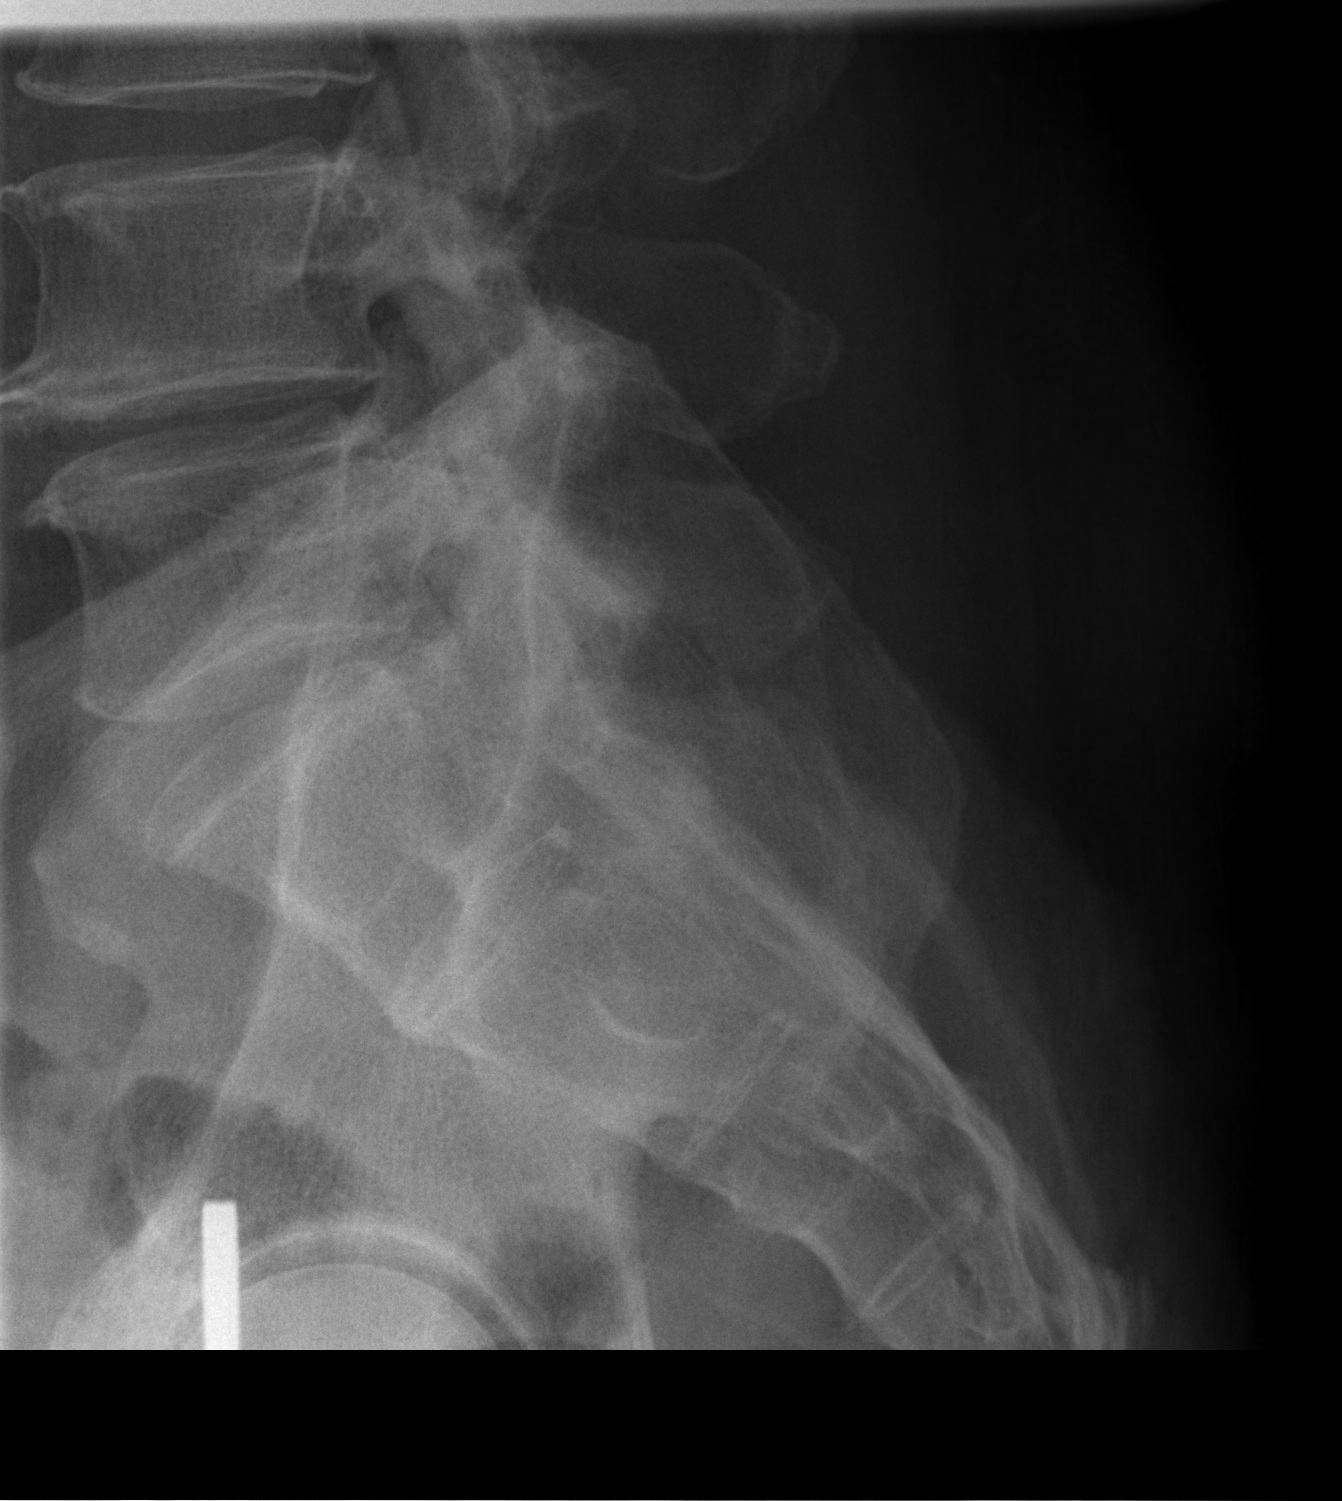

[3 of 3 positions shown; findings below may reference images not displayed]

FINDINGS: Five lumbar type vertebral bodies. No acute fracture or subluxation.
Vertebral body heights are preserved. Alignment is normal. Mild disc
height loss and endplate spurring at L4-L5, similar to prior study.
Mild lower lumbar facet arthropathy. The sacroiliac joints are
unremarkable. Aortic atherosclerosis.
IMPRESSION: Mild degenerative changes at L4-L5, similar to prior study.

## 2019-11-18 ENCOUNTER — Other Ambulatory Visit: Payer: Self-pay

## 2019-11-18 ENCOUNTER — Encounter: Payer: Self-pay | Admitting: Family Medicine

## 2019-11-18 ENCOUNTER — Ambulatory Visit (INDEPENDENT_AMBULATORY_CARE_PROVIDER_SITE_OTHER): Payer: Medicare Other | Admitting: Family Medicine

## 2019-11-18 DIAGNOSIS — M5442 Lumbago with sciatica, left side: Secondary | ICD-10-CM

## 2019-11-18 NOTE — Patient Instructions (Signed)
Good to see you Please try the exercises  They should call you about scheduling the epidural  Please try heat on the lower back   Please send me a message in MyChart with any questions or updates.  Please see me back in 4 weeks.   --Dr. Jordan Likes

## 2019-11-18 NOTE — Assessment & Plan Note (Addendum)
Acute on chronic in nature.  May have a component of left SI joint contributing to his pain.  Does have some weakness on hip abduction - counseled on home exercise therapy and supportive care - epidural  -Could consider SI joint injection and physical therapy.

## 2019-11-18 NOTE — Progress Notes (Signed)
Russell Shannon - 69 y.o. male MRN [id number]  Date of birth: May 23, 1950  SUBJECTIVE:  Including CC & ROS.  Chief Complaint  Patient presents with  . Follow-up    left hip    Russell Shannon is a 69 y.o. male that is presenting with left gluteal pain.  He does seem similar to his previous pain.  He has had epidurals in the past which seemed to resolve his pain.  No injury or inciting event.  Does not report it going down his leg significantly.   Review of Systems See HPI   HISTORY: Past Medical, Surgical, Social, and Family History Reviewed & Updated per EMR.   Pertinent Historical Findings include:  Past Medical History:  Diagnosis Date  . Arthritis    hips  . Diabetes mellitus   . Gout   . Hyperlipidemia   . Hypertension     Past Surgical History:  Procedure Laterality Date  . APPENDECTOMY  1971  . laceration hand Left 1975  . LUNG BIOPSY  1980    Family History  Problem Relation Age of Onset  . Hypertension Mother   . Hypertension Father   . Prostate cancer Brother   . Colon cancer Neg Hx   . Esophageal cancer Neg Hx   . Stomach cancer Neg Hx   . Rectal cancer Neg Hx   . Pancreatic cancer Neg Hx     Social History   Socioeconomic History  . Marital status: Married    Spouse name: Not on file  . Number of children: Not on file  . Years of education: Not on file  . Highest education level: Not on file  Occupational History  . Not on file  Tobacco Use  . Smoking status: Never Smoker  . Smokeless tobacco: Never Used  Substance and Sexual Activity  . Alcohol use: No  . Drug use: No  . Sexual activity: Yes  Other Topics Concern  . Not on file  Social History Narrative  . Not on file   Social Determinants of Health   Financial Resource Strain:   . Difficulty of Paying Living Expenses: Not on file  Food Insecurity:   . Worried About Programme researcher, broadcasting/film/video in the Last Year: Not on file  . Ran Out of Food in the Last Year: Not on file    Transportation Needs:   . Lack of Transportation (Medical): Not on file  . Lack of Transportation (Non-Medical): Not on file  Physical Activity:   . Days of Exercise per Week: Not on file  . Minutes of Exercise per Session: Not on file  Stress:   . Feeling of Stress : Not on file  Social Connections:   . Frequency of Communication with Friends and Family: Not on file  . Frequency of Social Gatherings with Friends and Family: Not on file  . Attends Religious Services: Not on file  . Active Member of Clubs or Organizations: Not on file  . Attends Banker Meetings: Not on file  . Marital Status: Not on file  Intimate Partner Violence:   . Fear of Current or Ex-Partner: Not on file  . Emotionally Abused: Not on file  . Physically Abused: Not on file  . Sexually Abused: Not on file     PHYSICAL EXAM:  VS: BP (!) 146/74   Pulse 74   Ht 5\' 9"  (1.753 m)   Wt 203 lb (92.1 kg)   BMI 29.98 kg/m  Physical Exam Gen: NAD,  alert, cooperative with exam, well-appearing MSK:  Left hip/back: Tenderness palpation of the left gluteus and SI joint. Normal internal and external rotation of the hip. Normal strength with hip flexion. No tenderness palpation of the greater trochanter. Negative straight leg raise. Neurovascularly intact     ASSESSMENT & PLAN:   Low back pain Acute on chronic in nature.  May have a component of left SI joint contributing to his pain.  Does have some weakness on hip abduction - counseled on home exercise therapy and supportive care - epidural  -Could consider SI joint injection and physical therapy.

## 2019-11-22 ENCOUNTER — Other Ambulatory Visit (HOSPITAL_BASED_OUTPATIENT_CLINIC_OR_DEPARTMENT_OTHER): Payer: Self-pay | Admitting: Internal Medicine

## 2019-11-22 ENCOUNTER — Ambulatory Visit: Payer: Medicare Other | Attending: Internal Medicine

## 2019-11-22 DIAGNOSIS — Z23 Encounter for immunization: Secondary | ICD-10-CM

## 2019-11-22 NOTE — Progress Notes (Signed)
   Covid-19 Vaccination Clinic  Name:  Russell Shannon    MRN: [id number] DOB: 1950-02-24  11/22/2019  Mr. Russell Shannon was observed post Covid-19 immunization for 15 minutes without incident. He was provided with Vaccine Information Sheet and instruction to access the V-Safe system. Vaccinated by Maryella Shivers.   Mr. Russell Shannon was instructed to call 911 with any severe reactions post vaccine: Marland Kitchen Difficulty breathing  . Swelling of face and throat  . A fast heartbeat  . A bad rash all over body  . Dizziness and weakness

## 2019-11-24 ENCOUNTER — Ambulatory Visit
Admission: RE | Admit: 2019-11-24 | Discharge: 2019-11-24 | Disposition: A | Discharge status: Home / Self Care | Payer: Medicare Other | Source: Ambulatory Visit | Attending: Family Medicine | Admitting: Family Medicine

## 2019-11-24 ENCOUNTER — Other Ambulatory Visit: Payer: Self-pay

## 2019-11-24 DIAGNOSIS — M5442 Lumbago with sciatica, left side: Secondary | ICD-10-CM

## 2019-11-24 MED ORDER — METHYLPREDNISOLONE ACETATE 40 MG/ML INJ SUSP (RADIOLOG
120.0000 mg | Freq: Once | INTRAMUSCULAR | Status: AC
  Administered 2019-11-24: 120 mg via EPIDURAL

## 2019-11-24 MED ORDER — IOPAMIDOL (ISOVUE-M 200) INJECTION 41%
1.0000 mL | Freq: Once | INTRAMUSCULAR | Status: AC
  Administered 2019-11-24: 1 mL via EPIDURAL

## 2019-11-24 NOTE — Discharge Instructions (Signed)

## 2019-11-29 MED FILL — PFIZER-BIONTECH COVID-19 VA: 30 | 1 days supply | Qty: 0 | Fill #0

## 2019-12-20 ENCOUNTER — Other Ambulatory Visit: Payer: Self-pay

## 2019-12-20 ENCOUNTER — Ambulatory Visit: Payer: Medicare Other | Admitting: Family Medicine

## 2019-12-20 ENCOUNTER — Encounter: Payer: Self-pay | Admitting: Family Medicine

## 2019-12-20 DIAGNOSIS — M5442 Lumbago with sciatica, left side: Secondary | ICD-10-CM

## 2019-12-20 NOTE — Patient Instructions (Signed)
Good to see you Please stay active   Please send me a message in MyChart with any questions or updates.  Please see [place] back as needed.   --Dr. Jordan Likes

## 2019-12-20 NOTE — Progress Notes (Signed)
Araceli Coufal - 69 y.o. male MRN [id number]  Date of birth: 05-Dec-1950  SUBJECTIVE:  Including CC & ROS.  Chief Complaint  Patient presents with  . Follow-up    left-sided low back    Hersh Minney is a 69 y.o. male that is following up for his low back pain.  He received epidural and is doing much better.  He continues to stay active with helping his knees.  Pain is been mild in nature.   Review of Systems See HPI   HISTORY: Past Medical, Surgical, Social, and Family History Reviewed & Updated per EMR.   Pertinent Historical Findings include:  Past Medical History:  Diagnosis Date  . Arthritis    hips  . Diabetes mellitus   . Gout   . Hyperlipidemia   . Hypertension     Past Surgical History:  Procedure Laterality Date  . APPENDECTOMY  1971  . laceration hand Left 1975  . LUNG BIOPSY  1980    Family History  Problem Relation Age of Onset  . Hypertension Mother   . Hypertension Father   . Prostate cancer Brother   . Colon cancer Neg Hx   . Esophageal cancer Neg Hx   . Stomach cancer Neg Hx   . Rectal cancer Neg Hx   . Pancreatic cancer Neg Hx     Social History   Socioeconomic History  . Marital status: Married    Spouse name: Not on file  . Number of children: Not on file  . Years of education: Not on file  . Highest education level: Not on file  Occupational History  . Not on file  Tobacco Use  . Smoking status: Never Smoker  . Smokeless tobacco: Never Used  Substance and Sexual Activity  . Alcohol use: No  . Drug use: No  . Sexual activity: Yes  Other Topics Concern  . Not on file  Social History Narrative  . Not on file   Social Determinants of Health   Financial Resource Strain:   . Difficulty of Paying Living Expenses: Not on file  Food Insecurity:   . Worried About Programme researcher, broadcasting/film/video in the Last Year: Not on file  . Ran Out of Food in the Last Year: Not on file  Transportation Needs:   . Lack of Transportation (Medical): Not  on file  . Lack of Transportation (Non-Medical): Not on file  Physical Activity:   . Days of Exercise per Week: Not on file  . Minutes of Exercise per Session: Not on file  Stress:   . Feeling of Stress : Not on file  Social Connections:   . Frequency of Communication with Friends and Family: Not on file  . Frequency of Social Gatherings with Friends and Family: Not on file  . Attends Religious Services: Not on file  . Active Member of Clubs or Organizations: Not on file  . Attends Banker Meetings: Not on file  . Marital Status: Not on file  Intimate Partner Violence:   . Fear of Current or Ex-Partner: Not on file  . Emotionally Abused: Not on file  . Physically Abused: Not on file  . Sexually Abused: Not on file     PHYSICAL EXAM:  VS: BP 136/80   Pulse 76   Ht 5\' 9"  (1.753 m)   Wt 197 lb (89.4 kg)   BMI 29.09 kg/m  Physical Exam Gen: NAD, alert, cooperative with exam, well-appearing    ASSESSMENT & PLAN:  Low back pain Symptoms have improved since the epidural.  Continues to stay active and pain is intermittent in nature. -Counseled on home exercise therapy and supportive care. -Follow-up as needed.

## 2019-12-20 NOTE — Assessment & Plan Note (Signed)
Symptoms have improved since the epidural.  Continues to stay active and pain is intermittent in nature. -Counseled on home exercise therapy and supportive care. -Follow-up as needed.

## 2019-12-21 ENCOUNTER — Other Ambulatory Visit: Payer: Self-pay | Admitting: Medical

## 2019-12-30 ENCOUNTER — Other Ambulatory Visit: Payer: Self-pay | Admitting: Medical

## 2020-03-01 ENCOUNTER — Other Ambulatory Visit: Payer: Self-pay | Admitting: Urology

## 2020-03-01 DIAGNOSIS — R972 Elevated prostate specific antigen [PSA]: Secondary | ICD-10-CM

## 2020-03-02 ENCOUNTER — Other Ambulatory Visit: Payer: Self-pay

## 2020-03-02 ENCOUNTER — Ambulatory Visit
Admission: RE | Admit: 2020-03-02 | Discharge: 2020-03-02 | Disposition: A | Discharge status: Home / Self Care | Payer: Medicare Other | Source: Ambulatory Visit | Attending: Urology | Admitting: Urology

## 2020-03-02 DIAGNOSIS — R972 Elevated prostate specific antigen [PSA]: Secondary | ICD-10-CM

## 2020-03-02 MED ORDER — GADOBENATE DIMEGLUMINE 529 MG/ML IV SOLN
18.0000 mL | Freq: Once | INTRAVENOUS | Status: AC | PRN
  Administered 2020-03-02: 18 mL via INTRAVENOUS

## 2020-03-25 ENCOUNTER — Other Ambulatory Visit: Payer: Self-pay | Admitting: Medical

## 2020-06-14 ENCOUNTER — Ambulatory Visit: Payer: Medicare Other | Attending: Internal Medicine

## 2020-06-14 DIAGNOSIS — Z23 Encounter for immunization: Secondary | ICD-10-CM

## 2020-06-14 NOTE — Progress Notes (Signed)
   Covid-19 Vaccination Clinic  Name:  Russell Shannon    MRN: [id number] DOB: [birth year]  06/14/2020  [name] was observed post Covid-19 immunization for 15 minutes without incident. He was provided with Vaccine Information Sheet and instruction to access the V-Safe system.   [name] was instructed to call 911 with any severe reactions post vaccine: Marland Kitchen Difficulty breathing  . Swelling of face and throat  . A fast heartbeat  . A bad rash all over body  . Dizziness and weakness   Immunizations Administered    Name Date Dose VIS Date Route   PFIZER Comrnaty(Gray TOP) Covid-19 Vaccine 06/14/2020 12:42 PM 0.3 mL 01/27/2020 Intramuscular   Manufacturer: [employer], [employer]   Lot: [id number]   NDC: [phone]

## 2020-06-16 ENCOUNTER — Other Ambulatory Visit (HOSPITAL_BASED_OUTPATIENT_CLINIC_OR_DEPARTMENT_OTHER): Payer: Self-pay

## 2020-06-16 MED ORDER — PFIZER-BIONT COVID-19 VAC-TRIS 30 MCG/0.3ML IM SUSP
INTRAMUSCULAR | 0 refills | Status: AC
  Filled 2020-06-16: qty 0.3, 1d supply, fill #0

## 2020-12-12 ENCOUNTER — Ambulatory Visit: Payer: Medicare Other | Attending: Internal Medicine

## 2020-12-12 DIAGNOSIS — Z23 Encounter for immunization: Secondary | ICD-10-CM

## 2020-12-12 NOTE — Progress Notes (Signed)
   Covid-19 Vaccination Clinic  Name:  Ludwin Flahive    MRN: [id number] DOB: March 13, 1950  12/12/2020  Mr. Hole was observed post Covid-19 immunization for 15 minutes without incident. He was provided with Vaccine Information Sheet and instruction to access the V-Safe system.   Mr. Delbene was instructed to call 911 with any severe reactions post vaccine: Difficulty breathing  Swelling of face and throat  A fast heartbeat  A bad rash all over body  Dizziness and weakness   Immunizations Administered     Name Date Dose VIS Date Route   Pfizer Covid-19 Vaccine Bivalent Booster 12/12/2020 12:44 PM 0.3 mL 10/18/2020 Intramuscular   Manufacturer: [employer], [employer]   Lot: [id number]   NDC: [phone]

## 2020-12-14 IMAGING — XA Imaging study
2 series · 2 of 2 positions shown · non-contrast
Comparison: none

CLINICAL DATA: Lumbosacral spondylosis without myelopathy with
radiculopathy. Left-sided low back, hip, and leg pain. Excellent
relief after prior L5-S1 epidural injection in [REDACTED].

[Series 1: ortho adipose · 1 of 1 slices shown (1 of 2)]
[im 1/1]
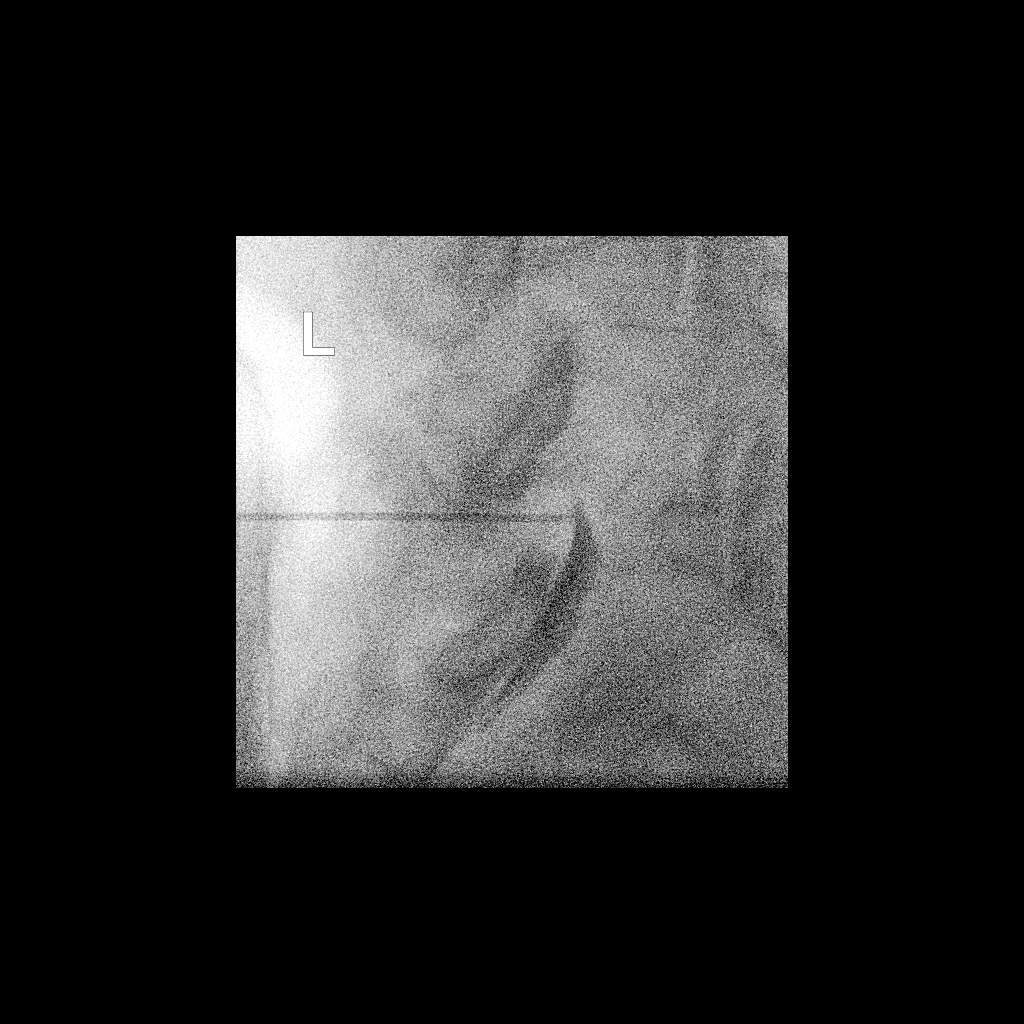

[Series 2: ortho adipose · 1 of 1 slices shown (2 of 2)]
[im 1/1]
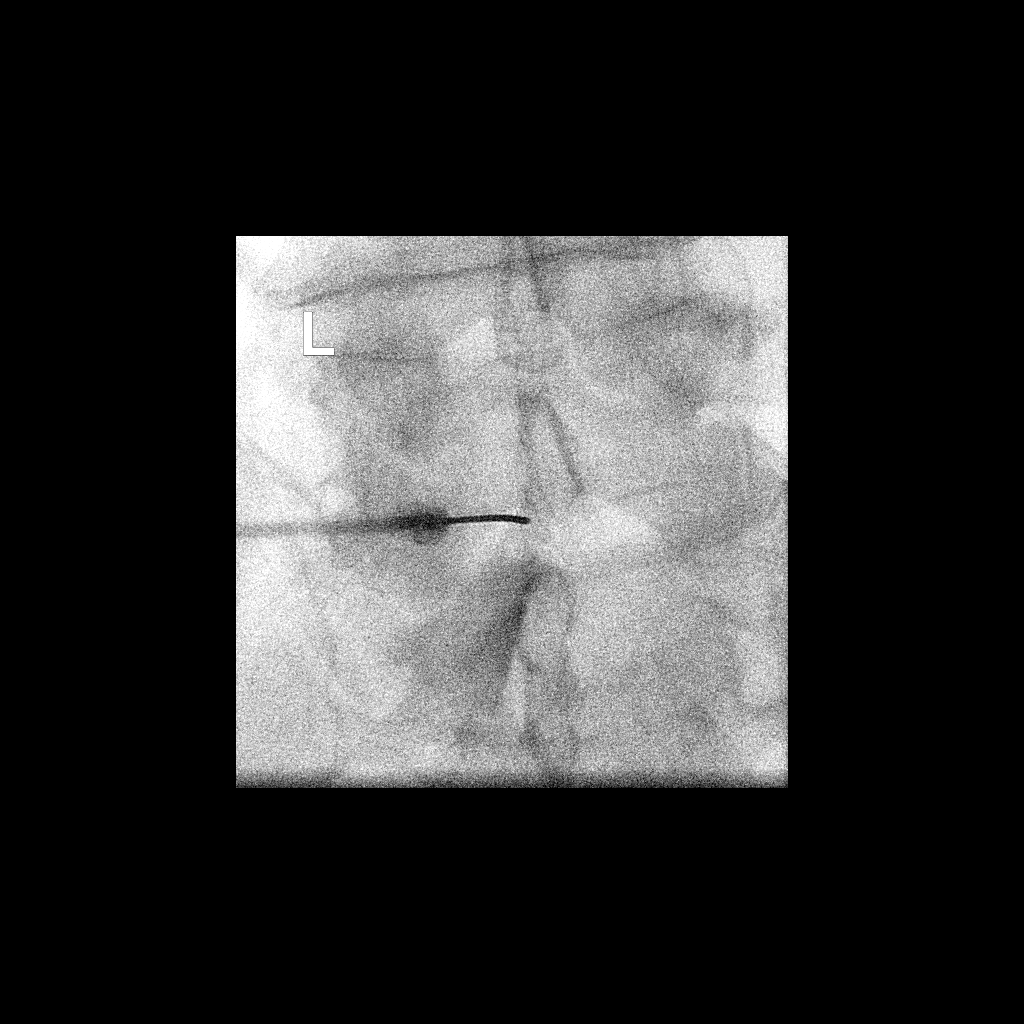

[2 of 2 positions shown; findings below may reference images not displayed]

FLUOROSCOPY TIME:  Radiation Exposure Index (as provided by the
fluoroscopic device): 4.4 mGy

Fluoroscopy Time:  6 seconds

Number of Acquired Images:  0

PROCEDURE:
The procedure, risks, benefits, and alternatives were explained to
the patient. Questions regarding the procedure were encouraged and
answered. The patient understands and consents to the procedure.

LUMBAR EPIDURAL INJECTION:

An interlaminar approach was performed on the left at L5-S1. The
overlying skin was cleansed and anesthetized. A 3.5 inch 20 gauge
epidural needle was advanced using loss-of-resistance technique.

DIAGNOSTIC EPIDURAL INJECTION:

Injection of Isovue-M 200 shows a good epidural pattern with spread
above and below the level of needle placement, primarily on the
left. No vascular opacification is seen.

THERAPEUTIC EPIDURAL INJECTION:

120 mg of Depo-Medrol mixed with 3 mL of 1% lidocaine were
instilled. The procedure was well-tolerated, and the patient was
discharged thirty minutes following the injection in good condition.

COMPLICATIONS:
None immediate.
IMPRESSION: Technically successful interlaminar epidural injection on the left
at L5-S1.

## 2021-01-05 ENCOUNTER — Other Ambulatory Visit (HOSPITAL_BASED_OUTPATIENT_CLINIC_OR_DEPARTMENT_OTHER): Payer: Self-pay

## 2021-01-05 MED ORDER — PFIZER COVID-19 VAC BIVALENT 30 MCG/0.3ML IM SUSP
INTRAMUSCULAR | 0 refills | Status: AC
  Filled 2021-01-05: qty 0.3, 1d supply, fill #0

## 2021-01-14 ENCOUNTER — Other Ambulatory Visit: Payer: Self-pay | Admitting: Medical

## 2021-03-25 ENCOUNTER — Emergency Department (HOSPITAL_BASED_OUTPATIENT_CLINIC_OR_DEPARTMENT_OTHER)
Admission: EM | Admit: 2021-03-25 | Discharge: 2021-03-25 | Disposition: A | Discharge status: Home / Self Care | Payer: Medicare Other | Attending: Emergency Medicine | Admitting: Emergency Medicine

## 2021-03-25 ENCOUNTER — Encounter (HOSPITAL_BASED_OUTPATIENT_CLINIC_OR_DEPARTMENT_OTHER): Payer: Self-pay | Admitting: Emergency Medicine

## 2021-03-25 ENCOUNTER — Other Ambulatory Visit: Payer: Self-pay

## 2021-03-25 DIAGNOSIS — Z79899 Other long term (current) drug therapy: Secondary | ICD-10-CM | POA: Diagnosis not present

## 2021-03-25 DIAGNOSIS — Z7984 Long term (current) use of oral hypoglycemic drugs: Secondary | ICD-10-CM | POA: Diagnosis not present

## 2021-03-25 DIAGNOSIS — Y92009 Unspecified place in unspecified non-institutional (private) residence as the place of occurrence of the external cause: Secondary | ICD-10-CM | POA: Diagnosis not present

## 2021-03-25 DIAGNOSIS — X500XXA Overexertion from strenuous movement or load, initial encounter: Secondary | ICD-10-CM | POA: Insufficient documentation

## 2021-03-25 DIAGNOSIS — Z7982 Long term (current) use of aspirin: Secondary | ICD-10-CM | POA: Diagnosis not present

## 2021-03-25 DIAGNOSIS — M25511 Pain in right shoulder: Secondary | ICD-10-CM | POA: Insufficient documentation

## 2021-03-25 MED ORDER — KETOROLAC TROMETHAMINE 15 MG/ML IJ SOLN
15.0000 mg | Freq: Once | INTRAMUSCULAR | Status: AC
  Administered 2021-03-25: 15 mg via INTRAMUSCULAR
  Filled 2021-03-25: qty 1

## 2021-03-25 MED ORDER — METHOCARBAMOL 500 MG PO TABS: 500.0000 mg | ORAL_TABLET | Freq: Two times a day (BID) | ORAL | 0 refills | Status: AC

## 2021-03-25 MED ORDER — DIAZEPAM 2 MG PO TABS
2.0000 mg | ORAL_TABLET | Freq: Once | ORAL | Status: AC
  Administered 2021-03-25: 2 mg via ORAL
  Filled 2021-03-25: qty 1

## 2021-03-25 MED ORDER — DICLOFENAC SODIUM 1 % EX GEL: 4.0000 g | Freq: Four times a day (QID) | CUTANEOUS | 0 refills | Status: AC

## 2021-03-25 MED ORDER — ACETAMINOPHEN 500 MG PO TABS
1000.0000 mg | ORAL_TABLET | Freq: Once | ORAL | Status: AC
  Administered 2021-03-25: 1000 mg via ORAL
  Filled 2021-03-25: qty 2

## 2021-03-25 NOTE — Discharge Instructions (Signed)
Use the gel as prescribed Also take tylenol 1000mg (2 extra strength) four times a day.  I have provided you a sling for comfort.  You are not intended to wear this 24 hours a day.  He needs to perform range of motion exercises with the shoulder at least 4 times a day.  If it hurts too much to lift it up then you can drop it to gravity and do circles.  Please follow-up with your family doctor.

## 2021-03-25 NOTE — ED Triage Notes (Signed)
Pt reports L chest pain Friday that lasted until Saturday afternoon. After chest pain subsided he noticed pain to his R neck that is worse with palpation.

## 2021-03-25 NOTE — ED Provider Notes (Signed)
[name] Provider Note   CSN: [id number] Arrival date & time: 03/25/21  8786     History  Chief Complaint  Patient presents with   Neck Pain    Russell Shannon is a 71 y.o. male.  71 yo M with right shoulder pain.  Going on for a day.  He denies any obvious injury but then later tells me that he was helping his daughter move some things at home and had been using his right arm a bit more than typical.  He had some left chest pain a few days ago that is completely resolved.  Was worse with certain positions moving.  Not exertional.   Neck Pain     Home Medications Prior to Admission medications   Medication Sig Start Date End Date Taking? Authorizing Provider  diclofenac Sodium (VOLTAREN) 1 % GEL Apply 4 g topically 4 (four) times daily. 03/25/21  Yes Deno Etienne, DO  methocarbamol (ROBAXIN) 500 MG tablet Take 1 tablet (500 mg total) by mouth 2 (two) times daily. 03/25/21  Yes Deno Etienne, DO  amLODipine (NORVASC) 10 MG tablet TAKE 1 TABLET BY MOUTH EVERY DAY 03/27/20   Saguier, Percell Miller, PA-C  aspirin EC 81 MG tablet Take 81 mg by mouth daily.    [provider]  blood glucose meter kit and supplies Check blood sugar twice a day ( E11.9). 02/02/18   Saguier, Percell Miller, PA-C  COVID-19 mRNA bivalent vaccine, Pfizer, (PFIZER COVID-19 VAC BIVALENT) injection Inject into the muscle. 12/12/20   Carlyle Basques, MD  COVID-19 mRNA Vac-TriS, Pfizer, (PFIZER-BIONT COVID-19 VAC-TRIS) SUSP injection Inject into the muscle. 06/14/20   Carlyle Basques, MD  gabapentin (NEURONTIN) 100 MG capsule Take 1 capsule (100 mg total) by mouth 3 (three) times daily as needed. 10/12/18   Rosemarie Ax, MD  Lancets [facility] ULTRASOFT) lancets Check sugar twice a day E11.9 02/02/19   Saguier, Percell Miller, PA-C  metFORMIN (GLUCOPHAGE) 500 MG tablet TAKE 2 TABLETS BY MOUTH TWICE A DAY 02/02/19   Saguier, Percell Miller, PA-C  omeprazole (PRILOSEC) 20 MG capsule Take 20 mg by mouth daily.     [provider]  [facility] ULTRA test strip USE TO TEST TWICE A DAY. DX CODE: E11.9 01/15/21   Saguier, Percell Miller, PA-C  simvastatin (ZOCOR) 40 MG tablet TAKE 1 TABLET BY MOUTH EVERYDAY AT BEDTIME 09/20/19   Saguier, Percell Miller, PA-C      Allergies    Patient has no known allergies.    Review of Systems   Review of Systems  Musculoskeletal:  Positive for neck pain.   Physical Exam Updated Vital Signs BP (!) 153/81 (BP Location: Right Arm)    Pulse 87    Temp 98.1 F (36.7 C) (Oral)    Resp 18    Ht '5\' 9"'  (1.753 m)    Wt 93.9 kg    SpO2 98%    BMI 30.57 kg/m  Physical Exam Vitals and nursing note reviewed.  Constitutional:      Appearance: He is well-developed.  HENT:     Head: Normocephalic and atraumatic.  Eyes:     Pupils: Pupils are equal, round, and reactive to light.  Neck:     Vascular: No JVD.  Cardiovascular:     Rate and Rhythm: Normal rate and regular rhythm.     Heart sounds: No murmur heard.   No friction rub. No gallop.  Pulmonary:     Effort: No respiratory distress.     Breath sounds: No wheezing.  Abdominal:     General: There is no distension.     Tenderness: There is no abdominal tenderness. There is no guarding or rebound.  Musculoskeletal:        General: Normal range of motion.     Cervical back: Normal range of motion and neck supple.     Comments: Points to his trapezius is area of pain though no specific reproduction with palpation of the trapezius.  No midline C-spine tenderness able to rotate his head 45 degrees in either direction without pain.  Has significant worsening of his pain with forward flexion of the shoulder at about 120 degrees.  Pulse motor and sensation intact distally.  No obvious bony tenderness.  No pain at the [facility] joint.  Skin:    Coloration: Skin is not pale.     Findings: No rash.  Neurological:     Mental Status: He is alert and oriented to person, place, and time.  Psychiatric:        Behavior: Behavior normal.    ED  Results / Procedures / Treatments   Labs (all labs ordered are listed, but only abnormal results are displayed) Labs Reviewed - No data to display  EKG None  Radiology No results found.  Procedures Procedures    Medications Ordered in ED Medications  ketorolac (TORADOL) 15 MG/ML injection 15 mg (15 mg Intramuscular Given 03/25/21 0722)  acetaminophen (TYLENOL) tablet 1,000 mg (1,000 mg Oral Given 03/25/21 0722)  diazepam (VALIUM) tablet 2 mg (2 mg Oral Given 03/25/21 3524)    ED Course/ Medical Decision Making/ A&P                           Medical Decision Making Risk OTC drugs. Prescription drug management.   Patient is a 71 y.o. male with a cc of R shoulder pain.  Started last night.  Occurred after he was doing some increased lifting for his daughter.  Suspect musculoskeletal by history and physical.  Atraumatic do not feel x-ray would be helpful at this time.  Question trapezius strain versus subacromial bursitis by history and exam.  Placed in a sling for comfort.  With some spasm we will do a short course of muscle relaxant.  PCP follow-up.  7:41 AM:  I have discussed the diagnosis/risks/treatment options with the patient and family.  Evaluation and diagnostic testing in the emergency department does not suggest an emergent condition requiring admission or immediate intervention beyond what has been performed at this time.  They will follow up with  PCP. We also discussed returning to the ED immediately if new or worsening sx occur. We discussed the sx which are most concerning (e.g., sudden worsening pain, fever, inability to tolerate by mouth) that necessitate immediate return. Medications administered to the patient during their visit and any new prescriptions provided to the patient are listed below.  Medications given during this visit Medications  ketorolac (TORADOL) 15 MG/ML injection 15 mg (15 mg Intramuscular Given 03/25/21 0722)  acetaminophen (TYLENOL) tablet 1,000 mg  (1,000 mg Oral Given 03/25/21 0722)  diazepam (VALIUM) tablet 2 mg (2 mg Oral Given 03/25/21 8185)     The patient appears reasonably screen and/or stabilized for discharge and I doubt any other medical condition or other [facility] requiring further screening, evaluation, or treatment in the ED at this time prior to discharge.         Final Clinical Impression(s) / ED Diagnoses Final diagnoses:  Acute pain  of right shoulder    Rx / DC Orders ED Discharge Orders          Ordered    diclofenac Sodium (VOLTAREN) 1 % GEL  4 times daily        03/25/21 0710    methocarbamol (ROBAXIN) 500 MG tablet  2 times daily        03/25/21 0710              Deno Etienne, DO 03/25/21 [phone]

## 2021-06-10 ENCOUNTER — Other Ambulatory Visit: Payer: Self-pay | Admitting: Medical

## 2021-10-04 ENCOUNTER — Encounter: Payer: Self-pay | Admitting: Family Medicine

## 2021-10-04 ENCOUNTER — Ambulatory Visit: Payer: Medicare Other | Admitting: Family Medicine

## 2021-10-04 VITALS — BP 124/82 | Ht 69.0 in | Wt 207.0 lb

## 2021-10-04 DIAGNOSIS — M5416 Radiculopathy, lumbar region: Secondary | ICD-10-CM | POA: Diagnosis not present

## 2021-10-04 MED ORDER — MELOXICAM 15 MG PO TABS: 15.0000 mg | ORAL_TABLET | Freq: Every day | ORAL | 2 refills | Status: DC | PRN

## 2021-10-04 NOTE — Patient Instructions (Signed)
Good to see you Please use heat  Please use the mobic as needed I have placed an order for the epidural at [facility] imaging   Please send me a message in MyChart with any questions or updates.  Please give me a call 1 week after the epidural.   --Dr. Jordan Likes

## 2021-10-04 NOTE — Assessment & Plan Note (Signed)
Acute on chronic in nature.  Having left-sided radicular pain that is similar to his previous pain -Counseled on home exercise therapy and supportive care. -Meloxicam. -Pursue epidural..

## 2021-10-04 NOTE — Progress Notes (Signed)
  Russell Shannon - 71 y.o. male MRN [id number]  Date of birth: 01/02/1951  SUBJECTIVE:  Including CC & ROS.  No chief complaint on file.   Russell Shannon is a 71 y.o. male that is presenting with acute on chronic left-sided radicular type pain.  He has had similar pain in the past that has responded well to an epidural.  The last time being 2 years ago.  No injury or inciting event.    Review of Systems See HPI   HISTORY: Past Medical, Surgical, Social, and Family History Reviewed & Updated per EMR.   Pertinent Historical Findings include:  Past Medical History:  Diagnosis Date   Arthritis    hips   Diabetes mellitus    Gout    Hyperlipidemia    Hypertension     Past Surgical History:  Procedure Laterality Date   APPENDECTOMY  1971   laceration hand Left 1975   LUNG BIOPSY  1980     PHYSICAL EXAM:  VS: BP 124/82 (BP Location: Left Arm, Patient Position: Sitting)   Ht 5\' 9"  (1.753 m)   Wt 207 lb (93.9 kg)   BMI 30.57 kg/m  Physical Exam Gen: NAD, alert, cooperative with exam, well-appearing MSK:  Neurovascularly intact       ASSESSMENT & PLAN:   Lumbar radiculopathy Acute on chronic in nature.  Having left-sided radicular pain that is similar to his previous pain -Counseled on home exercise therapy and supportive care. -Meloxicam. -Pursue epidural..

## 2021-10-09 ENCOUNTER — Ambulatory Visit
Admission: RE | Admit: 2021-10-09 | Discharge: 2021-10-09 | Disposition: A | Discharge status: Home / Self Care | Payer: Medicare Other | Source: Ambulatory Visit | Attending: Family Medicine | Admitting: Family Medicine

## 2021-10-09 DIAGNOSIS — M5416 Radiculopathy, lumbar region: Secondary | ICD-10-CM

## 2021-10-09 MED ORDER — IOPAMIDOL (ISOVUE-M 200) INJECTION 41%
1.0000 mL | Freq: Once | INTRAMUSCULAR | Status: AC
  Administered 2021-10-09: 1 mL via EPIDURAL

## 2021-10-09 MED ORDER — METHYLPREDNISOLONE ACETATE 40 MG/ML INJ SUSP (RADIOLOG
80.0000 mg | Freq: Once | INTRAMUSCULAR | Status: AC
  Administered 2021-10-09: 80 mg via EPIDURAL

## 2021-10-09 NOTE — Discharge Instructions (Signed)

## 2021-12-27 ENCOUNTER — Other Ambulatory Visit: Payer: Self-pay | Admitting: Family Medicine

## 2022-01-14 ENCOUNTER — Other Ambulatory Visit: Payer: Self-pay | Admitting: Medical

## 2022-02-11 ENCOUNTER — Other Ambulatory Visit: Payer: Self-pay | Admitting: Medical

## 2022-02-14 ENCOUNTER — Other Ambulatory Visit: Payer: Self-pay | Admitting: Medical

## 2022-03-19 ENCOUNTER — Other Ambulatory Visit: Payer: Self-pay | Admitting: Family Medicine

## 2022-05-13 ENCOUNTER — Other Ambulatory Visit: Payer: Self-pay | Admitting: Family Medicine

## 2022-06-05 ENCOUNTER — Encounter: Payer: Self-pay | Admitting: *Deleted

## 2022-07-07 ENCOUNTER — Other Ambulatory Visit: Payer: Self-pay | Admitting: Family Medicine

## 2022-07-11 ENCOUNTER — Other Ambulatory Visit (HOSPITAL_COMMUNITY): Payer: Self-pay

## 2023-01-06 ENCOUNTER — Other Ambulatory Visit (HOSPITAL_BASED_OUTPATIENT_CLINIC_OR_DEPARTMENT_OTHER): Payer: Self-pay

## 2023-01-06 MED ORDER — COVID-19 MRNA VAC-TRIS(PFIZER) 30 MCG/0.3ML IM SUSY
0.3000 mL | PREFILLED_SYRINGE | Freq: Once | INTRAMUSCULAR | 0 refills | Status: AC
  Filled 2023-01-06: qty 0.3, 1d supply, fill #0

## 2024-04-27 ENCOUNTER — Ambulatory Visit: Admitting: Medical
